# Patient Record
Sex: Male | Born: 1945 | Race: Black or African American | Hispanic: No | Smoking: Former smoker
Health system: Southern US, Community
[De-identification: ages and names within clinical notes are randomized; demographics above are authoritative.]

## PROBLEM LIST (undated history)

## (undated) DIAGNOSIS — M1990 Unspecified osteoarthritis, unspecified site: Secondary | ICD-10-CM

## (undated) DIAGNOSIS — K219 Gastro-esophageal reflux disease without esophagitis: Secondary | ICD-10-CM

## (undated) DIAGNOSIS — N200 Calculus of kidney: Secondary | ICD-10-CM

## (undated) DIAGNOSIS — T7840XA Allergy, unspecified, initial encounter: Secondary | ICD-10-CM

## (undated) DIAGNOSIS — I10 Essential (primary) hypertension: Secondary | ICD-10-CM

## (undated) DIAGNOSIS — N189 Chronic kidney disease, unspecified: Secondary | ICD-10-CM

## (undated) DIAGNOSIS — E785 Hyperlipidemia, unspecified: Secondary | ICD-10-CM

## (undated) DIAGNOSIS — H269 Unspecified cataract: Secondary | ICD-10-CM

## (undated) HISTORY — DX: Gastro-esophageal reflux disease without esophagitis: K21.9

## (undated) HISTORY — DX: Unspecified osteoarthritis, unspecified site: M19.90

## (undated) HISTORY — DX: Chronic kidney disease, unspecified: N18.9

## (undated) HISTORY — DX: Allergy, unspecified, initial encounter: T78.40XA

## (undated) HISTORY — DX: Unspecified cataract: H26.9

## (undated) HISTORY — DX: Calculus of kidney: N20.0

## (undated) HISTORY — PX: FOOT SURGERY: SHX648

## (undated) HISTORY — DX: Hyperlipidemia, unspecified: E78.5

## (undated) HISTORY — DX: Essential (primary) hypertension: I10

---

## 1998-04-27 ENCOUNTER — Ambulatory Visit (HOSPITAL_COMMUNITY): Admission: RE | Admit: 1998-04-27 | Discharge: 1998-04-27 | Payer: Self-pay | Admitting: Internal Medicine

## 1999-11-21 DIAGNOSIS — N189 Chronic kidney disease, unspecified: Secondary | ICD-10-CM

## 1999-11-21 HISTORY — DX: Chronic kidney disease, unspecified: N18.9

## 2003-11-06 ENCOUNTER — Encounter: Payer: Self-pay | Admitting: Internal Medicine

## 2004-10-26 ENCOUNTER — Ambulatory Visit: Payer: Self-pay | Admitting: Internal Medicine

## 2004-12-14 ENCOUNTER — Ambulatory Visit: Payer: Self-pay | Admitting: Internal Medicine

## 2005-03-21 ENCOUNTER — Ambulatory Visit: Payer: Self-pay | Admitting: Internal Medicine

## 2005-05-03 ENCOUNTER — Emergency Department (HOSPITAL_COMMUNITY): Admission: EM | Admit: 2005-05-03 | Discharge: 2005-05-03 | Payer: Self-pay | Admitting: Emergency Medicine

## 2005-05-12 ENCOUNTER — Ambulatory Visit: Payer: Self-pay | Admitting: Internal Medicine

## 2006-05-03 ENCOUNTER — Ambulatory Visit: Payer: Self-pay | Admitting: Internal Medicine

## 2006-06-26 ENCOUNTER — Ambulatory Visit: Payer: Self-pay | Admitting: Internal Medicine

## 2006-11-20 HISTORY — PX: OTHER SURGICAL HISTORY: SHX169

## 2007-05-31 ENCOUNTER — Telehealth (INDEPENDENT_AMBULATORY_CARE_PROVIDER_SITE_OTHER): Payer: Self-pay | Admitting: *Deleted

## 2007-06-25 ENCOUNTER — Ambulatory Visit: Payer: Self-pay | Admitting: Internal Medicine

## 2007-06-25 DIAGNOSIS — I10 Essential (primary) hypertension: Secondary | ICD-10-CM | POA: Insufficient documentation

## 2007-06-25 DIAGNOSIS — N644 Mastodynia: Secondary | ICD-10-CM | POA: Insufficient documentation

## 2007-09-05 DIAGNOSIS — M1990 Unspecified osteoarthritis, unspecified site: Secondary | ICD-10-CM | POA: Insufficient documentation

## 2007-09-13 ENCOUNTER — Ambulatory Visit: Payer: Self-pay | Admitting: Internal Medicine

## 2007-09-13 DIAGNOSIS — J309 Allergic rhinitis, unspecified: Secondary | ICD-10-CM | POA: Insufficient documentation

## 2007-09-13 DIAGNOSIS — E782 Mixed hyperlipidemia: Secondary | ICD-10-CM | POA: Insufficient documentation

## 2007-09-13 DIAGNOSIS — Q6600 Congenital talipes equinovarus, unspecified foot: Secondary | ICD-10-CM | POA: Insufficient documentation

## 2007-09-24 ENCOUNTER — Encounter (INDEPENDENT_AMBULATORY_CARE_PROVIDER_SITE_OTHER): Payer: Self-pay | Admitting: *Deleted

## 2007-10-02 ENCOUNTER — Ambulatory Visit: Payer: Self-pay | Admitting: Gastroenterology

## 2007-10-16 ENCOUNTER — Encounter: Payer: Self-pay | Admitting: Gastroenterology

## 2007-10-16 ENCOUNTER — Ambulatory Visit: Payer: Self-pay | Admitting: Gastroenterology

## 2007-10-16 ENCOUNTER — Encounter: Payer: Self-pay | Admitting: Internal Medicine

## 2008-09-22 ENCOUNTER — Telehealth (INDEPENDENT_AMBULATORY_CARE_PROVIDER_SITE_OTHER): Payer: Self-pay | Admitting: *Deleted

## 2008-10-08 ENCOUNTER — Ambulatory Visit: Payer: Self-pay | Admitting: Internal Medicine

## 2008-10-08 LAB — CONVERTED CEMR LAB
Albumin: 4.1 g/dL (ref 3.5–5.2)
BUN: 15 mg/dL (ref 6–23)
Cholesterol: 133 mg/dL (ref 0–200)
Hgb A1c MFr Bld: 5.5 % (ref 4.6–6.0)
LDL Cholesterol: 83 mg/dL (ref 0–99)
Potassium: 3.2 meq/L — ABNORMAL LOW (ref 3.5–5.1)
Total Protein: 7 g/dL (ref 6.0–8.3)
Triglycerides: 85 mg/dL (ref 0–149)
VLDL: 17 mg/dL (ref 0–40)

## 2008-10-16 ENCOUNTER — Ambulatory Visit: Payer: Self-pay | Admitting: Internal Medicine

## 2008-10-16 DIAGNOSIS — E876 Hypokalemia: Secondary | ICD-10-CM | POA: Insufficient documentation

## 2008-11-17 ENCOUNTER — Ambulatory Visit: Payer: Self-pay | Admitting: Internal Medicine

## 2008-11-24 ENCOUNTER — Encounter (INDEPENDENT_AMBULATORY_CARE_PROVIDER_SITE_OTHER): Payer: Self-pay | Admitting: *Deleted

## 2008-11-24 LAB — CONVERTED CEMR LAB
BUN: 16 mg/dL (ref 6–23)
Chloride: 101 meq/L (ref 96–112)
GFR calc non Af Amer: 80 mL/min
Glucose, Bld: 99 mg/dL (ref 70–99)
Potassium: 3.2 meq/L — ABNORMAL LOW (ref 3.5–5.1)

## 2009-01-26 ENCOUNTER — Ambulatory Visit: Payer: Self-pay | Admitting: Internal Medicine

## 2009-01-28 ENCOUNTER — Telehealth (INDEPENDENT_AMBULATORY_CARE_PROVIDER_SITE_OTHER): Payer: Self-pay | Admitting: *Deleted

## 2009-01-29 ENCOUNTER — Ambulatory Visit: Payer: Self-pay | Admitting: Internal Medicine

## 2009-02-01 ENCOUNTER — Encounter: Payer: Self-pay | Admitting: Internal Medicine

## 2009-02-02 ENCOUNTER — Ambulatory Visit: Payer: Self-pay | Admitting: Internal Medicine

## 2009-02-03 LAB — CONVERTED CEMR LAB
Cortisol, Plasma: 12.4 ug/dL
Potassium: 3.5 meq/L (ref 3.5–5.1)

## 2009-02-04 ENCOUNTER — Encounter (INDEPENDENT_AMBULATORY_CARE_PROVIDER_SITE_OTHER): Payer: Self-pay | Admitting: *Deleted

## 2009-02-09 ENCOUNTER — Ambulatory Visit: Payer: Self-pay | Admitting: Internal Medicine

## 2009-03-12 ENCOUNTER — Encounter: Payer: Self-pay | Admitting: Internal Medicine

## 2009-03-20 DEATH — deceased

## 2009-03-23 ENCOUNTER — Telehealth (INDEPENDENT_AMBULATORY_CARE_PROVIDER_SITE_OTHER): Payer: Self-pay | Admitting: *Deleted

## 2009-03-26 ENCOUNTER — Ambulatory Visit: Payer: Self-pay | Admitting: Internal Medicine

## 2009-03-27 LAB — CONVERTED CEMR LAB
BUN: 16 mg/dL (ref 6–23)
GFR calc non Af Amer: 60.41 mL/min (ref >60)
Potassium: 3.9 meq/L (ref 3.5–5.1)
Sodium: 139 meq/L (ref 135–145)

## 2009-03-29 ENCOUNTER — Encounter (INDEPENDENT_AMBULATORY_CARE_PROVIDER_SITE_OTHER): Payer: Self-pay | Admitting: *Deleted

## 2009-04-02 ENCOUNTER — Encounter: Admission: RE | Admit: 2009-04-02 | Discharge: 2009-04-02 | Payer: Self-pay | Admitting: Nephrology

## 2009-04-02 ENCOUNTER — Encounter: Payer: Self-pay | Admitting: Internal Medicine

## 2009-06-11 ENCOUNTER — Encounter: Payer: Self-pay | Admitting: Internal Medicine

## 2009-12-16 ENCOUNTER — Encounter: Payer: Self-pay | Admitting: Internal Medicine

## 2009-12-31 ENCOUNTER — Encounter (INDEPENDENT_AMBULATORY_CARE_PROVIDER_SITE_OTHER): Payer: Self-pay | Admitting: *Deleted

## 2010-02-11 ENCOUNTER — Ambulatory Visit: Payer: Self-pay | Admitting: Internal Medicine

## 2010-02-11 DIAGNOSIS — I781 Nevus, non-neoplastic: Secondary | ICD-10-CM | POA: Insufficient documentation

## 2010-02-11 DIAGNOSIS — E269 Hyperaldosteronism, unspecified: Secondary | ICD-10-CM | POA: Insufficient documentation

## 2010-03-28 ENCOUNTER — Ambulatory Visit: Payer: Self-pay | Admitting: Internal Medicine

## 2010-03-28 DIAGNOSIS — Z8679 Personal history of other diseases of the circulatory system: Secondary | ICD-10-CM | POA: Insufficient documentation

## 2010-03-28 DIAGNOSIS — R269 Unspecified abnormalities of gait and mobility: Secondary | ICD-10-CM | POA: Insufficient documentation

## 2010-03-29 ENCOUNTER — Ambulatory Visit: Payer: Self-pay | Admitting: Internal Medicine

## 2010-04-05 LAB — CONVERTED CEMR LAB
Basophils Absolute: 0.1 10*3/uL (ref 0.0–0.1)
CO2: 31 meq/L (ref 19–32)
Eosinophils Relative: 6.1 % — ABNORMAL HIGH (ref 0.0–5.0)
GFR calc non Af Amer: 60.22 mL/min (ref >60)
Glucose, Bld: 85 mg/dL (ref 70–99)
Monocytes Relative: 6.8 % (ref 3.0–12.0)
Neutrophils Relative %: 53 % (ref 43.0–77.0)
Platelets: 248 10*3/uL (ref 150.0–400.0)
Potassium: 4.8 meq/L (ref 3.5–5.1)
RDW: 13.1 % (ref 11.5–14.6)
Sodium: 145 meq/L (ref 135–145)
WBC: 7.3 10*3/uL (ref 4.5–10.5)

## 2010-04-08 ENCOUNTER — Telehealth: Payer: Self-pay | Admitting: Internal Medicine

## 2010-05-18 ENCOUNTER — Encounter: Payer: Self-pay | Admitting: Internal Medicine

## 2010-06-23 IMAGING — CT CT ABDOMEN WO/W CM
3 of 8 series · 13 of 36 positions shown, 18 images · IV contrast (READICAT/WATER)
Comparison: None

CLINICAL DATA: Hypertension, hyperaldosteronism, evaluate for
adrenal lesion.

CT ABDOMEN WITHOUT AND WITH CONTRAST, CT PELVIS WITHOUT CONTRAST
TECHNIQUE: Multidetector CT imaging of the abdomen was performed
following the standard protocol before and following the bolus
administration of intravenous contrast.Multidetector CT imaging of
the pelvis was performed following the standard protocol without
intravenous contrast.
Contrast: 125 ml Hmnipaque-3II

[Series 3: adrenal without · axial · non-contrast · 0.81mm/px · z∈[-378,+5]mm · 7 of 205 slices shown, 12 images (1 of 2)]
[im 26/205  soft-tissue]
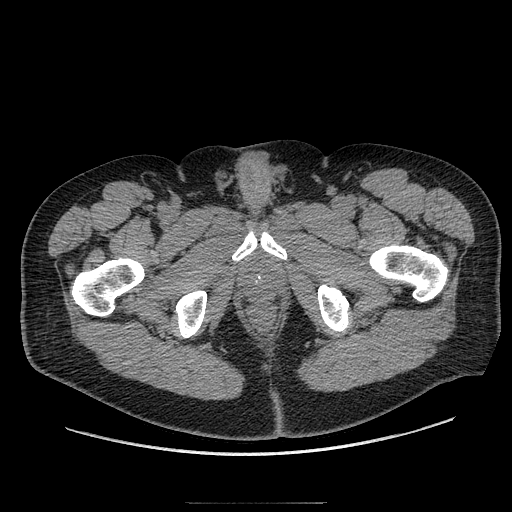
[im 26/205  bone]
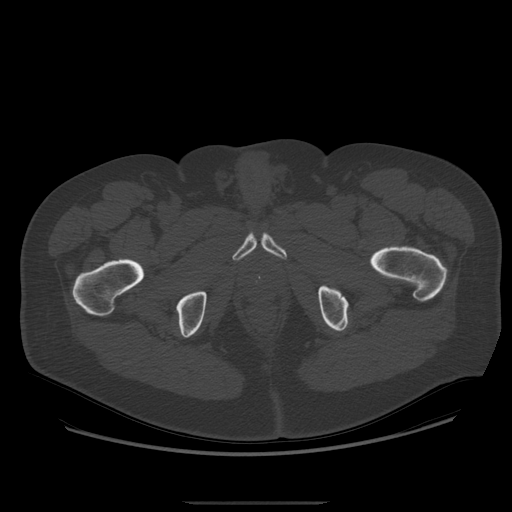
[im 52/205  soft-tissue]
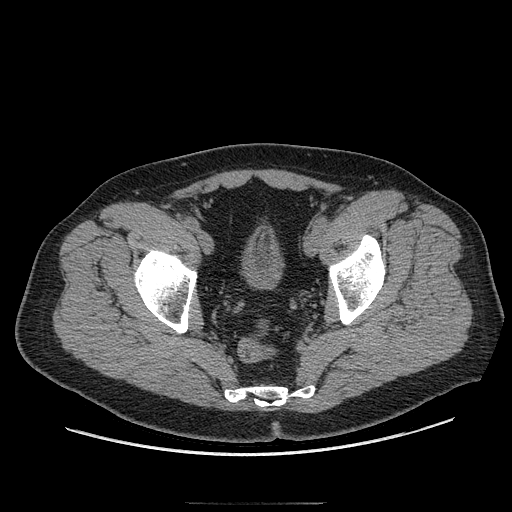
[im 77/205  soft-tissue]
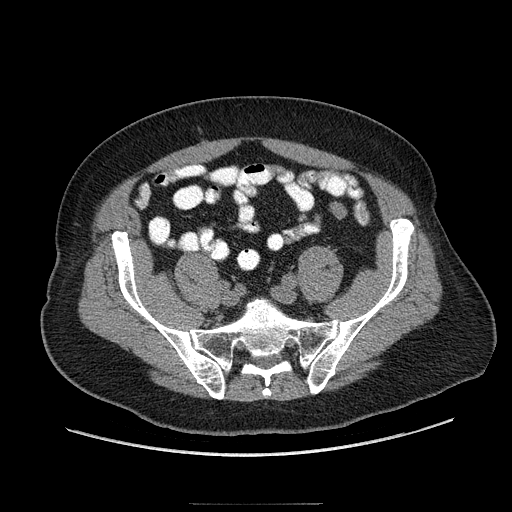
[im 103/205  soft-tissue]
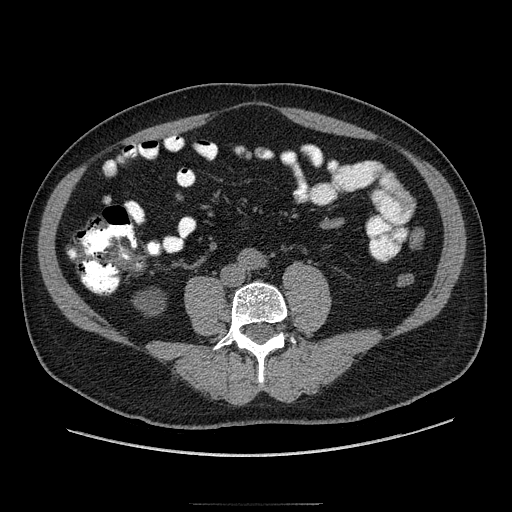
[im 103/205  lung]
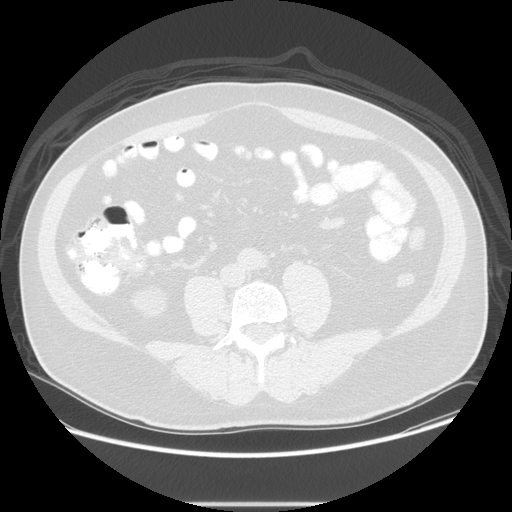
[im 128/205  soft-tissue]
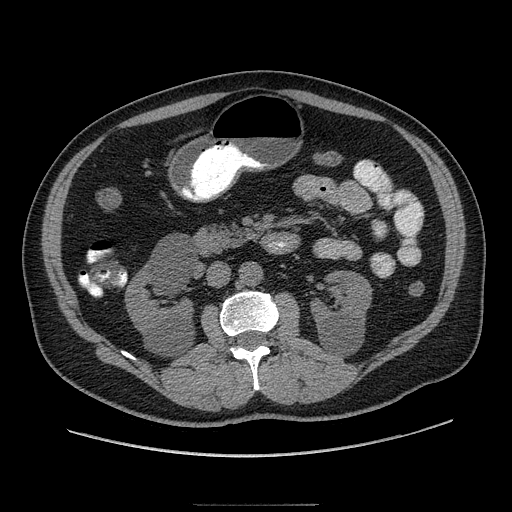
[im 128/205  lung]
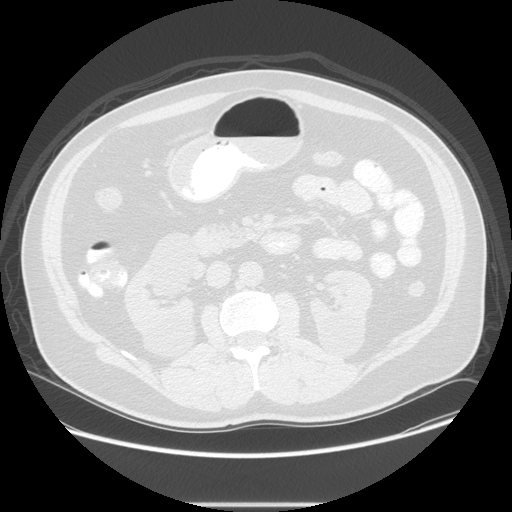
[im 154/205  soft-tissue]
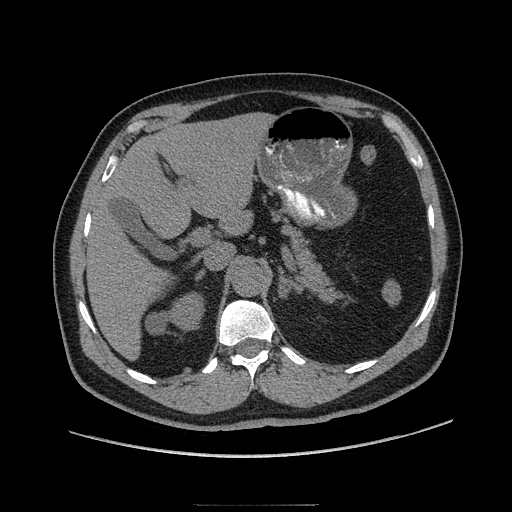
[im 154/205  lung]
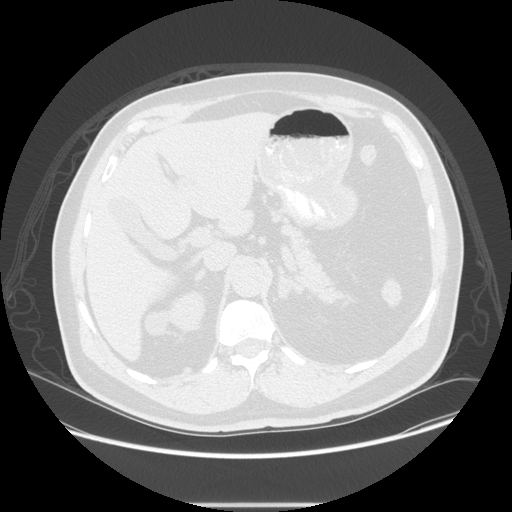
[im 179/205  soft-tissue]
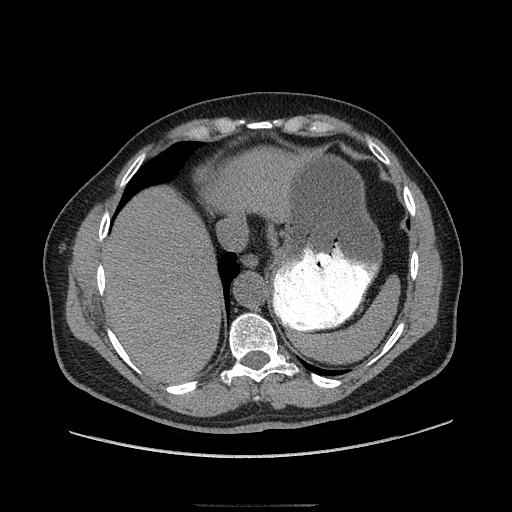
[im 179/205  lung]
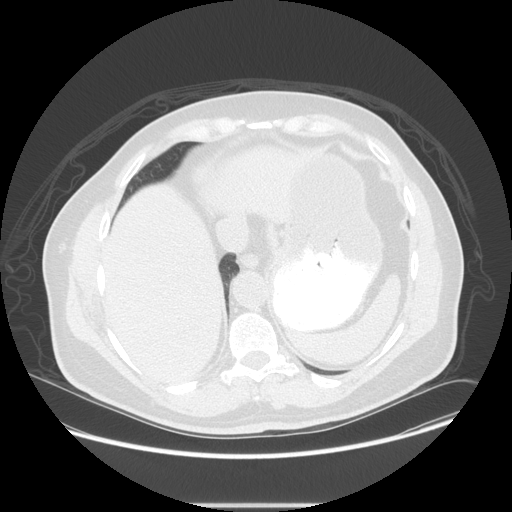

[Series 6: adrenal without · axial · non-contrast · 0.81mm/px · z∈[-162,-8]mm · 3 of 124 slices shown (2 of 2)]
[im 31/124  soft-tissue]
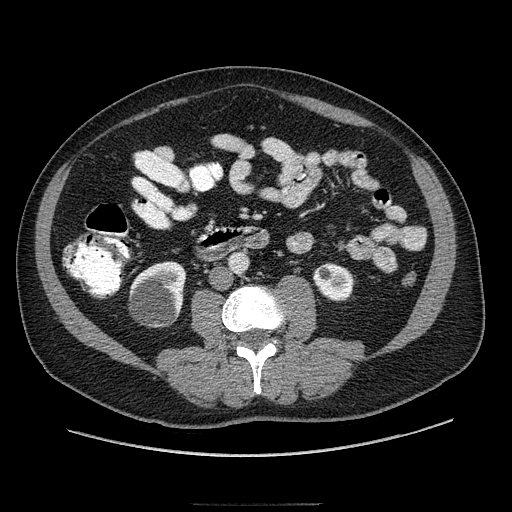
[im 62/124  soft-tissue]
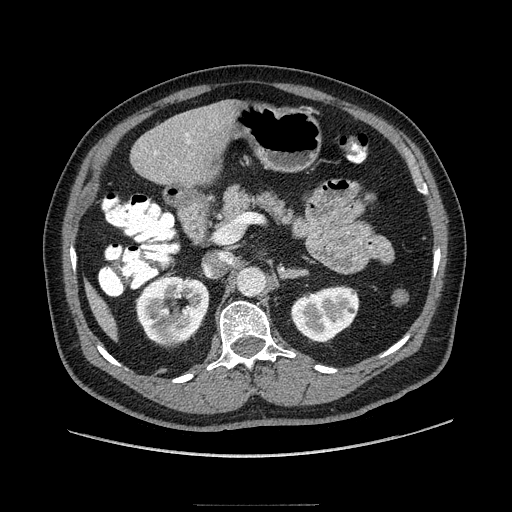
[im 93/124  soft-tissue]
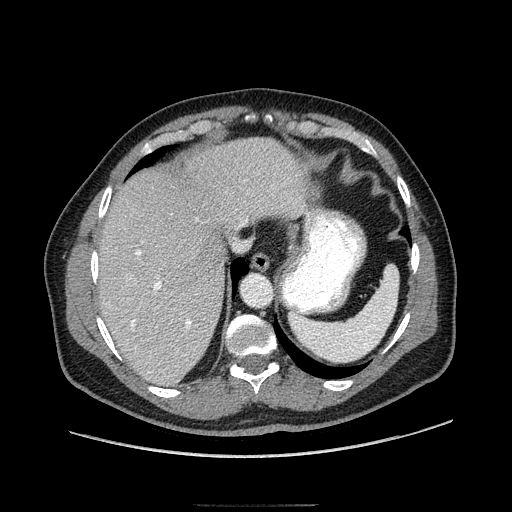

[Series 602: sagittal body · sagittal · 1.00mm/px · 3 of 163 slices shown]
[im 28/163  soft-tissue]
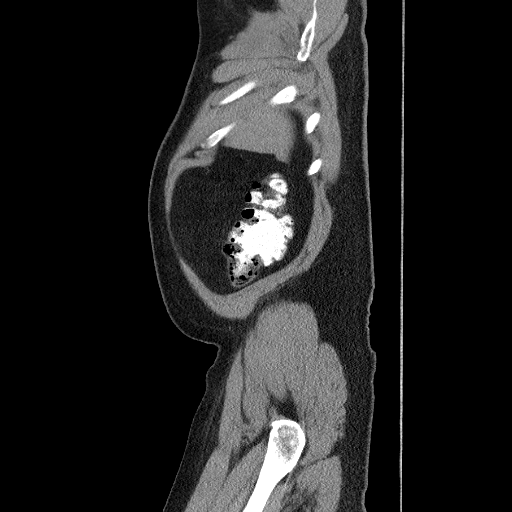
[im 55/163  soft-tissue]
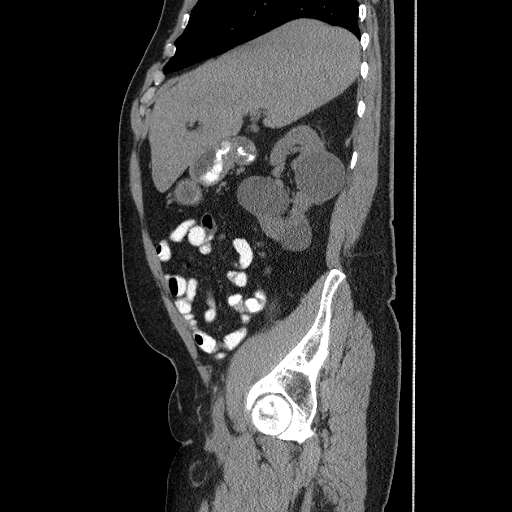
[im 82/163  soft-tissue]
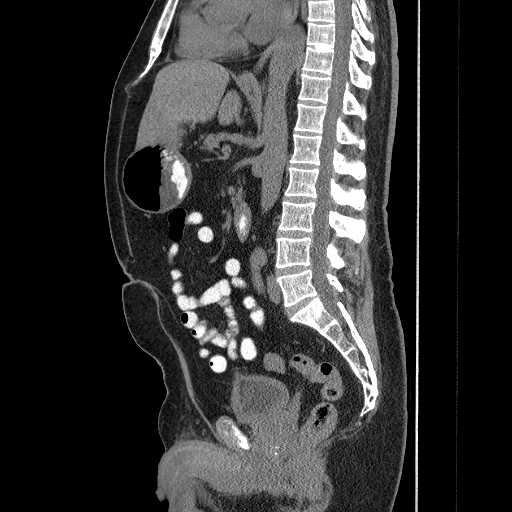

[13 of 36 positions shown; findings below may reference images not displayed]

FINDINGS: A 5 mm noncalcified nodule is noted on image #9 within
the anterior right middle lobe.  No other lung nodule is seen on
the limited views through the lung bases and no effusion is noted.
The right adrenal gland appears grossly normal on the unenhanced
study.  The left adrenal gland is more nodular, and CT with IV
contrast media is to be performed.  On this unenhanced study no
hepatic calcified granulomas are seen and no calcified gallstones
are noted.  No renal calculi are seen.

After contrast administration, the right adrenal gland again is
normal in size with no abnormality noted.  The left adrenal gland
is nodular but somewhat diffusely prominent. On the coronal images
there is nodular enlargement of the lateral limb inferiorly and a
nodule may be present of 16 x 19 mm.  Somewhat asymmetric adrenal
hyperplasia is also a consideration.

The liver enhances and there is a focus of increased enhancement
within the right lobe which may represent incidental hemangioma.  A
small low attenuation structure is noted in the very caudal right
lobe most likely benign, as a small hepatic cyst. The gallbladder
is well visualized with no abnormality noted.  The pancreas is
normal in size and the pancreatic duct is not dilated.  The spleen
is normal in size.  The kidneys enhance and there are multiple
bilateral renal cyst present.  No solid renal lesion is seen.  No
hydronephrosis is noted.  The aorta is normal in caliber.  No
adenopathy is seen.
IMPRESSION: 1. Somewhat nodular enlargement of the left adrenal gland
particularly the lateral limb.  This is consistent with either a
small adrenal adenoma or asymmetric adrenal hyperplasia.  The right
adrenal gland appears completely normal.
2.  Small enhancing focus in the right lobe of liver probably
represents incidental hemangiom, which becomes isodense with
adjacent liver on delayed images.
3.  Multiple  renal cysts bilaterally.
4.  5 mm noncalcified nodule in the anterior right middle lobe.
Consider follow-up.

CT PELVIS WITHOUT CONTRAST
FINDINGS: The urinary bladder is somewhat thick-walled but
decompressed and difficult to assess.  No pelvic mass, fluid, or
adenopathy is seen.  The terminal ileum appears normal, as does the
appendix.  No bony abnormality is seen.
IMPRESSION: No significant abnormality on CT unenhanced of the pelvis.  Urinary
bladder is decompressed and somewhat thick-walled.

## 2010-07-06 ENCOUNTER — Encounter: Payer: Self-pay | Admitting: Internal Medicine

## 2010-09-06 ENCOUNTER — Encounter (INDEPENDENT_AMBULATORY_CARE_PROVIDER_SITE_OTHER): Payer: Self-pay | Admitting: *Deleted

## 2010-10-27 ENCOUNTER — Encounter: Payer: Self-pay | Admitting: Gastroenterology

## 2010-11-01 ENCOUNTER — Encounter: Payer: Self-pay | Admitting: Gastroenterology

## 2010-11-01 ENCOUNTER — Telehealth (INDEPENDENT_AMBULATORY_CARE_PROVIDER_SITE_OTHER): Payer: Self-pay | Admitting: *Deleted

## 2010-11-03 ENCOUNTER — Ambulatory Visit: Payer: Self-pay | Admitting: Gastroenterology

## 2010-11-09 ENCOUNTER — Ambulatory Visit: Payer: Self-pay | Admitting: Gastroenterology

## 2010-11-18 DIAGNOSIS — Z860100 Personal history of colon polyps, unspecified: Secondary | ICD-10-CM | POA: Insufficient documentation

## 2010-11-25 ENCOUNTER — Other Ambulatory Visit: Payer: Self-pay | Admitting: Gastroenterology

## 2010-11-25 ENCOUNTER — Encounter (INDEPENDENT_AMBULATORY_CARE_PROVIDER_SITE_OTHER): Payer: Self-pay | Admitting: *Deleted

## 2010-11-25 DIAGNOSIS — K219 Gastro-esophageal reflux disease without esophagitis: Secondary | ICD-10-CM | POA: Insufficient documentation

## 2010-11-25 DIAGNOSIS — K5900 Constipation, unspecified: Secondary | ICD-10-CM | POA: Insufficient documentation

## 2010-11-25 LAB — IBC PANEL
Iron: 52 ug/dL (ref 42–165)
Saturation Ratios: 15.4 % — ABNORMAL LOW (ref 20.0–50.0)
Transferrin: 241.9 mg/dL (ref 212.0–360.0)

## 2010-11-25 LAB — VITAMIN B12: Vitamin B-12: 384 pg/mL (ref 211–911)

## 2010-11-25 LAB — FERRITIN: Ferritin: 111.5 ng/mL (ref 22.0–322.0)

## 2010-11-25 LAB — FOLATE: Folate: 8.8 ng/mL

## 2010-12-12 ENCOUNTER — Ambulatory Visit
Admission: RE | Admit: 2010-12-12 | Discharge: 2010-12-12 | Payer: Self-pay | Source: Home / Self Care | Attending: Gastroenterology | Admitting: Gastroenterology

## 2010-12-12 ENCOUNTER — Other Ambulatory Visit: Payer: Self-pay | Admitting: Gastroenterology

## 2010-12-13 LAB — FECAL OCCULT BLOOD, IMMUNOCHEMICAL: Fecal Occult Bld: NEGATIVE

## 2010-12-18 LAB — CONVERTED CEMR LAB
ALT: 28 units/L (ref 0–53)
Albumin: 4.6 g/dL (ref 3.5–5.2)
Alkaline Phosphatase: 59 units/L (ref 39–117)
BUN: 17 mg/dL (ref 6–23)
Basophils Relative: 0.4 % (ref 0.0–3.0)
Basophils Relative: 0.5 % (ref 0.0–1.0)
Bilirubin, Direct: 0 mg/dL (ref 0.0–0.3)
CO2: 30 meq/L (ref 19–32)
Calcium: 8.8 mg/dL (ref 8.4–10.5)
Cholesterol: 141 mg/dL (ref 0–200)
Eosinophils Absolute: 0.5 10*3/uL (ref 0.0–0.7)
GFR calc Af Amer: 87 mL/min
GFR calc non Af Amer: 72 mL/min
HCT: 39.8 % (ref 39.0–52.0)
HDL: 32.5 mg/dL — ABNORMAL LOW (ref >39.0)
LDL Cholesterol: 92 mg/dL (ref 0–99)
Lymphocytes Relative: 32.7 % (ref 12.0–46.0)
Lymphs Abs: 1.9 10*3/uL (ref 0.7–4.0)
MCHC: 33.3 g/dL (ref 30.0–36.0)
MCV: 92.8 fL (ref 78.0–100.0)
Monocytes Absolute: 0.4 10*3/uL (ref 0.1–1.0)
Monocytes Relative: 8.2 % (ref 3.0–11.0)
Neutro Abs: 3.2 10*3/uL (ref 1.4–7.7)
Neutro Abs: 3.4 10*3/uL (ref 1.4–7.7)
Neutrophils Relative %: 55.9 % (ref 43.0–77.0)
PSA: 2.31 ng/mL (ref 0.10–4.00)
Platelets: 237 10*3/uL (ref 150–400)
RBC: 4.29 M/uL (ref 4.22–5.81)
RBC: 4.41 M/uL (ref 4.22–5.81)
TSH: 2.25 microintl units/mL (ref 0.35–5.50)
Total CHOL/HDL Ratio: 4.5
Total Protein: 6.9 g/dL (ref 6.0–8.3)
Total Protein: 7.3 g/dL (ref 6.0–8.3)
Triglycerides: 109 mg/dL (ref 0–149)
VLDL: 22 mg/dL (ref 0–40)
VLDL: 22.8 mg/dL (ref 0.0–40.0)

## 2010-12-20 NOTE — Assessment & Plan Note (Signed)
Summary: dizzy/cbs   Vital Signs:  Patient profile:   65 year old male Weight:      209.4 pounds BMI:     27.73 Temp:     98.8 degrees F oral Pulse rate:   79 / minute Resp:     15 per minute BP supine:   130 / 88  (left arm) BP sitting:   136 / 88  (left arm) BP standing:   134 / 90  (left arm) Cuff size:   large  Vitals Entered By: Shonna Chock (Mar 28, 2010 4:33 PM) CC: Dizzy Comments REVIEWED MED LIST, PATIENT AGREED DOSE AND INSTRUCTION CORRECT  Pulse Supine: 68 Pulse Standing: 80   CC:  Dizzy.  History of Present Illness: Onset 4  weeks  ago as sense of  imbalance while walking , unrelated  to position change , fasting , neuro or cardiac triggers. Symptoms lasted < 60 seconds ; no recurrence since but he feels "unsteady" with standing up after being seated  for  a while  or occasionally "imbalanced" as if "wearing new glasses" with  walking. No BPV symptoms. Some anxiety related to symptoms." My gait is like an old person's". No FH CVA or Parkinson's or other Neuro Diseases. Recent labs reviewed; all essentially normal.  Allergies: 1)  ! Asa  Review of Systems Eyes:  Denies blurring, double vision, and vision loss-both eyes. ENT:  Denies decreased hearing, nasal congestion, ringing in ears, and sinus pressure; No frontal headache or purulence; some minor facial pain. CV:  Denies chest pain or discomfort and palpitations. Resp:  Denies shortness of breath. GI:  Denies bloody stools and dark tarry stools. Neuro:  Denies brief paralysis, disturbances in coordination, numbness, sensation of room spinning, tingling, and weakness. Psych:  Complains of anxiety; denies depression, easily angered, easily tearful, and irritability.  Physical Exam  General:  well-nourished,in no acute distress; alert,appropriate and cooperative throughout examination Eyes:  No corneal or conjunctival inflammation noted. EOMI except slightly decreased accommodation. Arcus senilis.Perrla. Field of   Vision grossly normal. Ears:  External ear exam shows no significant lesions or deformities.  Otoscopic examination reveals clear canals, tympanic membranes are intact bilaterally without bulging, retraction, inflammation or discharge. Hearing is grossly normal bilaterally. Whisper heard @ 6 feet . Tuning fork exam WNL. Nose:  External nasal examination shows no deformity or inflammation. Nasal mucosa are pink and moist without lesions or exudates. Mouth:  Oral mucosa and oropharynx without lesions or exudates.  Teeth in good repair; upper & lower partials. Lungs:  Normal respiratory effort, chest expands symmetrically. Lungs are clear to auscultation, no crackles or wheezes. Heart:  Normal rate and regular rhythm. S4 with slurring; increased  S2 ; without gallop, murmur, click, rub or other extra sounds. Pulses:  R and L carotid,radia  pulses are full and equal bilaterally w/o bruits  Extremities:  No clubbing, cyanosis, edema. Neurologic:  alert & oriented X3, cranial nerves II-XII intact, strength normal in all extremities, sensation intact to light touch, gait normal, DTRs symmetrically   decreased @ knees, finger-to-nose normal, and Romberg negative.  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 No  rigidity or spasticity of limbs. No Parkinson's stigmata clinically. RAM WNL; alliterative speech WNL. No pronator drift. Skin:  Intact without suspicious lesions or rashes Cervical Nodes:  No lymphadenopathy noted Axillary Nodes:  No palpable lymphadenopathy Psych:  memory intact for recent and remote, normally interactive, and good eye contact.     Impression &  Recommendations:  Problem # 1:  UNSTEADY GAIT (ICD-781.2)  Problem # 2:  POSTURAL HYPOTENSION, HX OF (ICD-V12.50) not demonstrated on exam  Complete Medication List: 1)  Amlodipine Besylate 5 Mg Tabs (Amlodipine besylate) .Marland Kitchen.. 1 tablet daily 2)  Simvastatin 40 Mg Tabs (Simvastatin) .Marland Kitchen.. 1 at bedtime 3)  Spironolactone 25 Mg Tabs  (Spironolactone) .... 2 by mouth once daily 4)  Cemetidine  .... As needed  Patient Instructions: 1)  Scedule fasting labs:BMP ;CBC w/ Diff . Do Isometric Exercises pre standingas discussed.

## 2010-12-20 NOTE — Letter (Signed)
Summary: Primary Care Appointment Letter  [facility]  [address] [place], [place] [address]   Phone: [phone]  Fax: [phone]    12/31/2009 MRN: [id number]  [facility] [address] [place], [place]  [address]  Dear Mr. Katrinka Blazing,   Your Primary Care Physician Marga Melnick MD has indicated that:    ___X____it is time to schedule an appointment(Please call to schedule an appointment "Physcial with fasting labs") This is necessary to continue refilling medications.    _______you missed your appointment on______ and need to call and          reschedule.    _______you need to have lab work done.    _______you need to schedule an appointment discuss lab or test results.    _______you need to call to reschedule your appointment that is                       scheduled on _________.     Please call our office as soon as possible. Our phone number is [phone]. Please press option 1. Our office is open 8a-5p, Monday through Friday.     Thank you,    [facility]

## 2010-12-20 NOTE — Letter (Signed)
Summary: [facility]  [facility]   Imported By: Lanelle Bal 07/14/2010 13:50:54  _____________________________________________________________________  External Attachment:    Type:   Image     Comment:   External Document

## 2010-12-20 NOTE — Progress Notes (Signed)
Summary: gait still off, dizinness improving  Phone Note Call from Patient Call back at Work Phone [phone]   Caller: Patient Summary of Call: pt still c/o muscle weakness in legs when standing causing gait to be a little shaky. pt states that dizziness has improve some but still have episode periodically.................Marland KitchenFelecia Deloach CMA  Apr 08, 2010 1:06 PM

## 2010-12-20 NOTE — Assessment & Plan Note (Signed)
Summary: cpx/kdc   Vital Signs:  Patient profile:   65 year old male Height:      73 inches Weight:      209.2 pounds BMI:     27.70 Temp:     98.2 degrees F oral Pulse rate:   71 / minute Resp:     14 per minute BP sitting:   124 / 86  (left arm) Cuff size:   large  Vitals Entered By: Shonna Chock (February 11, 2010 11:09 AM)  Comments REVIEWED MED LIST, PATIENT AGREED DOSE AND INSTRUCTION CORRECT    History of Present Illness: Mr. Buras is here for a physical; he is asymptomatic. Preventive healthcare measures reviewed; these are up to date except for POA designation. Dr Reginal Lutes 's evaluation reviewed.  Preventive Screening-Counseling & Management  Alcohol-Tobacco     Smoking Status: quit  Allergies: 1)  ! Jonne Ply  Past History:  Past Medical History: rhinoconjunctivitis seasonal renal calculus, Dr Larey Dresser Colonic polyps, hx of, Dr Jarold Motto Hypertension Hyperlipidemia: NMR 2004: LDL 122( 1637/1067), TG 134,HDL 35. LDL goal = < 100. Hypokalemia in context of Hyperaldosterone state, resolved with Spironolactone  Past Surgical History: congenital clubbing of  feet, S/P corrective surgery X 12 renal calculus X 1 , passed spontaneously ER visit for palpitations ; hypokalemia documented Colon polypectomy 2008, Dr Jarold Motto, due 2011  Family History: Father: d  MVA Mother: HTN,lipids Siblings:1/2  bros  arthritis ; MGF DM;MGM breast cancer  Social History: no diet Occupation: Brewing technologist  Alcohol use-no Regular exercise-no Widower, wife died of Pulmonary HTN 12-04-2022 Former Smoker: quit 1973  Review of Systems  The patient denies anorexia, fever, weight loss, weight gain, vision loss, decreased hearing, hoarseness, chest pain, syncope, dyspnea on exertion, peripheral edema, prolonged cough, headaches, hemoptysis, abdominal pain, melena, hematochezia, severe indigestion/heartburn, hematuria, incontinence, depression, unusual weight change,  abnormal bleeding, enlarged lymph nodes, and angioedema.         Some "light flashes" peripherally intermittently. Increase in # of skin tags; no PMH of skin cancer.  Physical Exam  General:  well-nourished; alert,appropriate and cooperative throughout examination Head:  Normocephalic and atraumatic without obvious abnormalities. No apparent alopecia ; goatee Eyes:  No corneal or conjunctival inflammation noted. EOMI. Perrla but small Ears:  External ear exam shows no significant lesions or deformities.  Otoscopic examination reveals clear canals, tympanic membranes are intact bilaterally without bulging, retraction, inflammation or discharge. Hearing is grossly normal bilaterally. Nose:  External nasal examination shows no deformity or inflammation. Nasal mucosa are pink and moist without lesions or exudates. Mouth:  Oral mucosa and oropharynx without lesions or exudates.  Teeth in good repair. Upper partials Neck:  No deformities, masses, or tenderness noted. Lungs:  Normal respiratory effort, chest expands symmetrically. Lungs are clear to auscultation, no crackles or wheezes. Heart:  Normal rate and regular rhythm. S1  normal without gallop, murmur, click, rub or other extra sounds. S2 accentuated Abdomen:  Bowel sounds positive,abdomen soft and non-tender without masses, organomegaly or hernias noted. Rectal:  No external abnormalities noted. Normal sphincter tone. No rectal masses or tenderness. Genitalia:  Testes bilaterally descended without nodularity or masses, but tender L epididymal area w/o palpable abnormality. No scrotal masses or lesions. No penis lesions or urethral discharge. Prostate:  Prostate gland firm and smooth, no enlargement, nodularity, tenderness, mass, asymmetry or induration. Msk:  Slight asymmetry of thoracic musculature Pulses:  R and L carotid,radial,femoral,dorsalis pedis and posterior tibial pulses are full and equal bilaterally Extremities:  No clubbing,  cyanosis, edema. Post op & congenital feet deformities   Neurologic:  alert & oriented X3 and DTRs symmetrical and normal.   Skin:  Irregular leaf nevus R ant chest; myriad nevi. Cherry angiomata.   Cervical Nodes:  No lymphadenopathy noted Axillary Nodes:  No palpable lymphadenopathy Inguinal Nodes:  No significant adenopathy Psych:  memory intact for recent and remote, normally interactive, and good eye contact.     Impression & Recommendations:  Problem # 1:  ROUTINE GENERAL MEDICAL EXAM@HEALTH  CARE FACL (ICD-V70.0)  Orders: EKG w/ Interpretation (93000) Venipuncture (60454) TLB-Lipid Panel (80061-LIPID) TLB-CBC Platelet - w/Differential (85025-CBCD) TLB-Hepatic/Liver Function Pnl (80076-HEPATIC) TLB-TSH (Thyroid Stimulating Hormone) (84443-TSH) TLB-PSA (Prostate Specific Antigen) (84153-PSA) Dermatology Referral (Derma)  Problem # 2:  HYPERALDOSTERONISM (ICD-255.10)  controlled with Spironolactone  Orders: Venipuncture (09811) Prescription Created Electronically [phone])  Problem # 3:  HYPOPOTASSEMIA (ICD-276.8)  due to #2, corrected  Orders: Venipuncture (29562)  Problem # 4:  HYPERTENSION (ICD-401.9)  controlled His updated medication list for this problem includes:    Amlodipine Besylate 5 Mg Tabs (Amlodipine besylate) .Marland Kitchen... 1 tablet daily    Spironolactone 25 Mg Tabs (Spironolactone) .Marland Kitchen... 2 by mouth once daily  Orders: EKG w/ Interpretation (93000) Venipuncture (13086) Prescription Created Electronically [phone])  Problem # 5:  HYPERLIPIDEMIA (ICD-272.2)  His updated medication list for this problem includes:    Simvastatin 40 Mg Tabs (Simvastatin) .Marland Kitchen... 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 1 tablet daily 2)  Simvastatin 40 Mg Tabs (Simvastatin) .Marland Kitchen.. 1 at bedtime 3)  Spironolactone 25 Mg Tabs (Spironolactone) .... 2 by mouth once daily 4)  Cemetidine  .... As needed  Patient Instructions: 1)  Check your Blood Pressure regularly. If it is above: 135/85 ON AVERAGE you should make an appointment. Consider POA as discussed Prescriptions: SPIRONOLACTONE 25 MG TABS (SPIRONOLACTONE) 2 by mouth once daily  #180 x 3   Entered and Authorized by:   Marga Melnick MD   Signed by:   Marga Melnick MD on 02/11/2010   Method used:   Faxed to ...       [facility] [address] [phone]* (retail)       [address]       [place], [place]  [address]       Ph: [phone] or [phone]       Fax: [phone]   RxID:   [id number] SIMVASTATIN 40 MG  TABS (SIMVASTATIN) 1 at bedtime  #90 x 3   Entered and Authorized by:   Marga Melnick MD   Signed by:   Marga Melnick MD on 02/11/2010   Method used:   Faxed to ...       [facility] [address] [phone]* (retail)       [address]       [place], [place]  [address]       Ph: [phone] or [phone]       Fax: [phone]   RxID:   [id number]   Appended Document: cpx/kdc  Laboratory Results   Urine Tests   Date/Time Reported: February 11, 2010 1:22 PM   Routine Urinalysis  Color: yellow Appearance: Clear Glucose: negative   (Normal Range: Negative) Bilirubin: negative   (Normal Range: Negative) Ketone: negative   (Normal Range: Negative) Spec. Gravity: 1.015   (Normal Range: 1.003-1.035) Blood: negative   (Normal Range: Negative) pH: 5.0   (Normal Range: 5.0-8.0) Protein: negative   (Normal Range: Negative) Urobilinogen: negative   (Normal Range: 0-1) Nitrite: negative   (Normal Range: Negative) Leukocyte Esterace: negative   (Normal Range: Negative)    Comments: Floydene Flock  February 11, 2010 1:23 PM

## 2010-12-20 NOTE — Consult Note (Signed)
Summary: [facility]  [facility]   Imported By: Lanelle Bal 05/30/2010 10:45:37  _____________________________________________________________________  External Attachment:    Type:   Image     Comment:   External Document

## 2010-12-20 NOTE — Letter (Signed)
Summary: Pre Visit Letter Revised  [facility]  [address] [place], [place] [address]   Phone: [phone]  Fax: [phone]        10/27/2010 MRN: [id number]  Kevin Ochoa [address] [place], [place]  [address]             Procedure Date:  12-21 at 11am           Dr Jarold Motto - Recall Colon   Welcome to the Gastroenterology Division at [facility].    You are scheduled to see a nurse for your pre-procedure visit on 11-03-10 at 11am on the 3rd floor at [facility], [address]  We ask that you try to arrive at our office 15 minutes prior to your appointment time to allow for check-in.  Please take a minute to review the attached form.  If you answer "Yes" to one or more of the questions on the first page, we ask that you call the person listed at your earliest opportunity.  If you answer "No" to all of the questions, please complete the rest of the form and bring it to your appointment.    Your nurse visit will consist of discussing your medical and surgical history, your immediate family medical history, and your medications.   If you are unable to list all of your medications on the form, please bring the medication bottles to your appointment and we will list them.  We will need to be aware of both prescribed and over the counter drugs.  We will need to know exact dosage information as well.    Please be prepared to read and sign documents such as consent forms, a financial agreement, and acknowledgement forms.  If necessary, and with your consent, a friend or relative is welcome to sit-in on the nurse visit with you.  Please bring your insurance card so that we may make a copy of it.  If your insurance requires a referral to see a specialist, please bring your referral form from your primary care physician.  No co-pay is required for this nurse visit.     If you cannot keep your appointment, please call [phone] to cancel or reschedule  prior to your appointment date.  This allows [place] the opportunity to schedule an appointment for another patient in need of care.    Thank you for choosing [facility] for your medical needs.  We appreciate the opportunity to care for you.  Please visit [place] at our website  to learn more about our practice.  Sincerely, The Gastroenterology Division

## 2010-12-20 NOTE — Letter (Signed)
Summary: Colonoscopy Letter  [facility]  [address] [place], [place] [address]   Phone: [phone]  Fax: [phone]      September 06, 2010 MRN: [id number]   [facility] [address] [place], [place]  [address]   Dear Kevin Ochoa,   According to your medical record, it is time for you to schedule a Colonoscopy. The American Cancer Society recommends this procedure as a method to detect early colon cancer. Patients with a family history of colon cancer, or a personal history of colon polyps or inflammatory bowel disease are at increased risk.  This letter has been generated based on the recommendations made at the time of your procedure. If you feel that in your particular situation this may no longer apply, please contact our office.  Please call our office at [phone] to schedule this appointment or to update your records at your earliest convenience.  Thank you for cooperating with [place] to provide you with the very best care possible.   Sincerely,   Vania Rea. Jarold Motto, M.D.  [facility] Gastroenterology Division [phone]

## 2010-12-20 NOTE — Letter (Signed)
Summary: [facility]  [facility]   Imported By: Lanelle Bal 02/09/2010 10:05:31  _____________________________________________________________________  External Attachment:    Type:   Image     Comment:   External Document

## 2010-12-22 NOTE — Progress Notes (Signed)
Summary: Pt. on Plavix   Phone Note Outgoing Call   Call placed by: Almyra Brace Call placed to: Patient Reason for Call: Get patient information Details for Reason: Verify if pt. on Plavix Summary of Call: Spoke with pt. and he is on Plavix. His Neurologist prescribes it.  Advised pt. that a scheduler would be calling him to set up an Office visit due to our policy on blood thinners. Initial call taken by: Wyona Almas RN,  November 01, 2010 12:58 PM

## 2010-12-22 NOTE — Assessment & Plan Note (Signed)
Summary: RECALL COLON/YF    History of Present Illness Visit Type: Initial Consult Primary GI MD: Sheryn Bison MD FACP FAGA Primary Provider: Marga Melnick, MD  Requesting Provider: Marga Melnick, MD  Chief Complaint: Recall colon. Pt c/o occasional GERD and constipation. Pt is on Plavix  History of Present Illness:   NO GI COMPLAINTS.Marland KitchenPOLYP REMOVED 3 Y AGO.HAS HAD PLAVIX RX. PER [facility] PER NEUROLOGY.   GI Review of Systems    Reports acid reflux and  heartburn.      Denies abdominal pain, belching, bloating, chest pain, dysphagia with liquids, dysphagia with solids, loss of appetite, nausea, vomiting, vomiting blood, weight loss, and  weight gain.      Reports constipation.     Denies anal fissure, black tarry stools, change in bowel habit, diarrhea, diverticulosis, fecal incontinence, heme positive stool, hemorrhoids, irritable bowel syndrome, jaundice, light color stool, liver problems, rectal bleeding, and  rectal pain.    Current Medications (verified): 1)  Amlodipine Besylate 5 Mg  Tabs (Amlodipine Besylate) .Marland Kitchen.. 1 Tablet Daily 2)  Simvastatin 40 Mg  Tabs (Simvastatin) .Marland Kitchen.. 1 At Bedtime 3)  Spironolactone 25 Mg Tabs (Spironolactone) .... 2 By Mouth Once Daily 4)  Cimetidine Acid Reducer 200 Mg Tabs (Cimetidine) .... As Needed 5)  Plavix 75 Mg Tabs (Clopidogrel Bisulfate) .... One Tablet By Mouth Once Daily  Allergies (verified): 1)  ! Asa  Past History:  Past medical, surgical, family and social histories (including risk factors) reviewed for relevance to current acute and chronic problems.  Past Medical History: rhinoconjunctivitis seasonal renal calculus, Dr Larey Dresser Colonic polyps adenomatous polyp , Dr Jarold Motto Hypertension Hyperlipidemia: NMR 2004: LDL 122( 1637/1067), TG 134,HDL 35. LDL goal = < 100. Hypokalemia in context of Hyperaldosterone state, resolved with Spironolactone Arthritis GERD  Past Surgical History: Reviewed history from  02/11/2010 and no changes required. congenital clubbing of  feet, S/P corrective surgery X 12 renal calculus X 1 , passed spontaneously ER visit for palpitations ; hypokalemia documented Colon polypectomy 2008, Dr Jarold Motto, due 2011  Family History: Reviewed history from 02/11/2010 and no changes required. Father: d  MVA Mother: HTN,lipids Siblings:1/2  bros  arthritis ; MGF DM;MGM breast cancer No FH of Colon Cancer:  Social History: Reviewed history from 02/11/2010 and no changes required. Occupation: Brewing technologist  Childern Alcohol use-no Regular exercise-no Widower, wife died of Pulmonary HTN December 05, 2022 Former Smoker: quit 1973 Daily Caffeine Use: less then 1  no diet   Review of Systems       The patient complains of allergy/sinus and arthritis/joint pain.  The patient denies anemia, anxiety-new, back pain, blood in urine, breast changes/lumps, change in vision, confusion, cough, coughing up blood, depression-new, fainting, fatigue, fever, headaches-new, hearing problems, heart murmur, heart rhythm changes, itching, menstrual pain, muscle pains/cramps, night sweats, nosebleeds, pregnancy symptoms, shortness of breath, skin rash, sleeping problems, sore throat, swelling of feet/legs, swollen lymph glands, thirst - excessive , urination - excessive , urination changes/pain, urine leakage, vision changes, and voice change.    Vital Signs:  Patient profile:   65 year old male Height:      73 inches Weight:      211 pounds BMI:     27.94 BSA:     2.20 Pulse rate:   88 / minute Pulse rhythm:   regular BP sitting:   136 / 74  (left arm) Cuff size:   regular  Vitals Entered By: Ok Anis CMA (November 25, 2010 9:41 AM)  Physical  Exam  General:  Well developed, well nourished, no acute distress.healthy appearing.   Head:  Normocephalic and atraumatic. Eyes:  PERRLA, no icterus.exam deferred to patient's ophthalmologist.   Neck:  Supple; no masses or  thyromegaly.NO BRUITS NOTED. Lungs:  Clear throughout to auscultation. Heart:  Regular rate and rhythm; no murmurs, rubs,  or bruits. Abdomen:  Soft, nontender and nondistended. No masses, hepatosplenomegaly or hernias noted. Normal bowel sounds. Rectal:  deferred until time of colonoscopy.   Extremities:  No clubbing, cyanosis, edema or deformities noted. Neurologic:  Alert and  oriented x4;  grossly normal neurologically. Cervical Nodes:  No significant cervical adenopathy. Psych:  Alert and cooperative. Normal mood and affect.   Impression & Recommendations:  Problem # 1:  CONSTIPATION (ICD-564.00) Assessment Unchanged INCREASE FIBER AND as needed MIRALAX.Marland Kitchen  Problem # 2:  COLONIC POLYPS, ADENOMATOUS, HX OF (ICD-V12.72) Assessment: Unchanged CHECK STOOL CARDS AND ANEMIA PROFILE....REPEAT EXAM 2 YEARS.Marland Kitchen Orders: TLB-B12, Serum-Total ONLY (04540-J81) TLB-Ferritin (82728-FER) TLB-Folic Acid (Folate) (82746-FOL) TLB-IBC Pnl (Iron/FE;Transferrin) (83550-IBC)  Patient Instructions: 1)  Copy sent to : Marga Melnick, MD & Levert Feinstein, MD 2)  We will mail you a reminder letter in 2014 about your recall colonoscopy. 3)  Please go to the basement today for your labs.  4)  The medication list was reviewed and reconciled.  All changed / newly prescribed medications were explained.  A complete medication list was provided to the patient / caregiver.

## 2010-12-22 NOTE — Letter (Signed)
Summary: New Patient letter  [facility]  [address] [place], [place] [address]   Phone: [phone]  Fax: [phone]       11/01/2010 MRN: [id number]  [facility] [address] [place], [place]  [address]  Dear Mr. Coard,  Welcome to the Gastroenterology Division at [facility].    You are scheduled to see Dr.  Jarold Motto on 11-25-2010 at 9:30am on the 3rd floor at [facility], [address]  We ask that you try to arrive at our office 15 minutes prior to your appointment time to allow for check-in.  We would like you to complete the enclosed self-administered evaluation form prior to your visit and bring it with you on the day of your appointment.  We will review it with you.  Also, please bring a complete list of all your medications or, if you prefer, bring the medication bottles and we will list them.  Please bring your insurance card so that we may make a copy of it.  If your insurance requires a referral to see a specialist, please bring your referral form from your primary care physician.  Co-payments are due at the time of your visit and may be paid by cash, check or credit card.     Your office visit will consist of a consult with your physician (includes a physical exam), any laboratory testing he/she may order, scheduling of any necessary diagnostic testing (e.g. x-ray, ultrasound, CT-scan), and scheduling of a procedure (e.g. Endoscopy, Colonoscopy) if required.  Please allow enough time on your schedule to allow for any/all of these possibilities.    If you cannot keep your appointment, please call [phone] to cancel or reschedule prior to your appointment date.  This allows [place] the opportunity to schedule an appointment for another patient in need of care.  If you do not cancel or reschedule by 5 p.m. the business day prior to your appointment date, you will be charged a $50.00 late cancellation/no-show fee.    Thank you for choosing  [facility] for your medical needs.  We appreciate the opportunity to care for you.  Please visit [place] at our website  to learn more about our practice.                     Sincerely,                                                             The Gastroenterology Division

## 2010-12-22 NOTE — Letter (Signed)
Summary: [facility]: Immunoassay Fecal Occult Blood (iFOB) Order [facility]  [address] [place], [place] [address]   Phone: [phone]  Fax: [phone]      [facility]: Immunoassay Fecal Occult Blood (iFOB) Order Form   November 25, 2010 MRN: [id number]   Kevin Ochoa Dec 03, 1945   Physicican Name:Dr Sheryn Bison  Diagnosis Code:v12.72      Harlow Mares CMA (AAMA)

## 2011-03-10 ENCOUNTER — Other Ambulatory Visit: Payer: Self-pay | Admitting: Internal Medicine

## 2011-03-13 ENCOUNTER — Other Ambulatory Visit: Payer: Self-pay | Admitting: *Deleted

## 2011-03-13 NOTE — Telephone Encounter (Signed)
30 MIN CPX APPOINTMENT DUE

## 2011-05-10 ENCOUNTER — Other Ambulatory Visit: Payer: Self-pay | Admitting: Internal Medicine

## 2011-05-31 ENCOUNTER — Other Ambulatory Visit: Payer: Self-pay | Admitting: Internal Medicine

## 2011-05-31 MED ORDER — SPIRONOLACTONE 25 MG PO TABS: 25.0000 mg | ORAL_TABLET | Freq: Two times a day (BID) | ORAL | Status: DC

## 2011-05-31 MED ORDER — SIMVASTATIN 40 MG PO TABS: 40.0000 mg | ORAL_TABLET | Freq: Every day | ORAL | Status: DC

## 2011-05-31 NOTE — Telephone Encounter (Addendum)
Left message on voicemail for patient informing him we will fill 2 meds #90 with no refills (last appointment was over 1 year ago), at pending appointment for 06/2011 patient will be given refills. I informed patient he will have to contact his Neurologist for refills on Plavix (phone note in centricity indicates plavix is rx'ed by Neuro)

## 2011-07-06 ENCOUNTER — Ambulatory Visit (INDEPENDENT_AMBULATORY_CARE_PROVIDER_SITE_OTHER): Payer: 59 | Admitting: Internal Medicine

## 2011-07-06 ENCOUNTER — Encounter: Payer: Self-pay | Admitting: Internal Medicine

## 2011-07-06 VITALS — BP 126/90 | HR 82 | Temp 98.9°F | Resp 12 | Ht 73.0 in | Wt 205.8 lb

## 2011-07-06 DIAGNOSIS — Z860100 Personal history of colon polyps, unspecified: Secondary | ICD-10-CM

## 2011-07-06 DIAGNOSIS — Z0000 Encounter for general adult medical examination without abnormal findings: Secondary | ICD-10-CM

## 2011-07-06 DIAGNOSIS — I10 Essential (primary) hypertension: Secondary | ICD-10-CM

## 2011-07-06 DIAGNOSIS — E782 Mixed hyperlipidemia: Secondary | ICD-10-CM

## 2011-07-06 LAB — BASIC METABOLIC PANEL
CO2: 27 mEq/L (ref 19–32)
Chloride: 100 mEq/L (ref 96–112)
Potassium: 3.9 mEq/L (ref 3.5–5.1)
Sodium: 137 mEq/L (ref 135–145)

## 2011-07-06 LAB — CBC WITH DIFFERENTIAL/PLATELET
Basophils Relative: 0.6 % (ref 0.0–3.0)
Eosinophils Absolute: 0.4 10*3/uL (ref 0.0–0.7)
Eosinophils Relative: 6.9 % — ABNORMAL HIGH (ref 0.0–5.0)
Hemoglobin: 13.1 g/dL (ref 13.0–17.0)
MCHC: 33.4 g/dL (ref 30.0–36.0)
MCV: 90.6 fl (ref 78.0–100.0)
Monocytes Absolute: 0.4 10*3/uL (ref 0.1–1.0)
Neutro Abs: 3 10*3/uL (ref 1.4–7.7)
RBC: 4.32 Mil/uL (ref 4.22–5.81)

## 2011-07-06 LAB — LIPID PANEL
HDL: 44.5 mg/dL (ref >39.00)
LDL Cholesterol: 90 mg/dL (ref 0–99)
Total CHOL/HDL Ratio: 4

## 2011-07-06 LAB — HEPATIC FUNCTION PANEL
Albumin: 4.7 g/dL (ref 3.5–5.2)
Alkaline Phosphatase: 49 U/L (ref 39–117)
Bilirubin, Direct: 0 mg/dL (ref 0.0–0.3)

## 2011-07-06 LAB — TSH: TSH: 1.12 u[IU]/mL (ref 0.35–5.50)

## 2011-07-06 MED ORDER — SPIRONOLACTONE 25 MG PO TABS: 25.0000 mg | ORAL_TABLET | Freq: Two times a day (BID) | ORAL | Status: DC

## 2011-07-06 MED ORDER — SIMVASTATIN 40 MG PO TABS: 40.0000 mg | ORAL_TABLET | Freq: Every day | ORAL | Status: DC

## 2011-07-06 NOTE — Patient Instructions (Signed)
Preventive Health Care: Exercise at least 30-45 minutes a day,  3-4 days a week.  Eat a low-fat diet with lots of fruits and vegetables, up to 7-9 servings per day. Consume less than 40 grams of sugar per day from foods & drinks with High Fructose Corn Sugar as # 1,2,3 or # 4 on label. Health Care Power of Attorney & Living Will. Complete if not in place ; these place you in charge of your health care decisions. 

## 2011-07-06 NOTE — Progress Notes (Signed)
Subjective:    Patient ID: Kevin Ochoa, male    DOB: [birth year], 65 y.o.   MRN: [id number]  HPI  Kevin Ochoa  is here for a physical; he has no acute issues.     Review of Systems#1  HYPERTENSION: Disease Monitoring: Blood pressure range-well controlled, typically 120/80  Chest pain, palpitations- no       Dyspnea- no Medications: Compliance- yes  Lightheadedness,Syncope- lightheadedness improved with avoiding prolonged fasting  & fluid replacement    Edema- no #2 HYPERLIPIDEMIA: Disease Monitoring: See symptoms for Hypertension Medications: Compliance- yes  Abd pain, bowel changes- no   Muscle aches- no but some weakness from lack of exercise WGN:FAOZHYQM/VHQION/GEXBMW- no       Visual problems- no Hypoglycemic symptoms- controlled as noted           Objective:   Physical Exam Gen.: Healthy and well-nourished in appearance. Alert, appropriate and cooperative throughout exam. Head: Normocephalic without obvious abnormalities;goatee Eyes: No corneal or conjunctival inflammation noted. Pupils equal round reactive to light and accommodation. Fundal exam is benign without hemorrhages, exudate, papilledema. Extraocular motion intact. Vision grossly norma with lenses. Arcus senilis. Ears: External  ear exam reveals no significant lesions or deformities. Canals clear .TMs normal. Hearing is grossly normal bilaterally. Nose: External nasal exam reveals no deformity or inflammation. Nasal mucosa are pink and moist. No lesions or exudates noted. Septum normal Mouth: Oral mucosa and oropharynx reveal no lesions or exudates. Teeth in good repair. Upper & lower partials Neck: No deformities, masses, or tenderness noted. Range of motion &. Thyroid  normal. Lungs: Normal respiratory effort; chest expands symmetrically. Lungs are clear to auscultation without rales, wheezes, or increased work of breathing. Heart: Normal rate and rhythm. Normal S1 and increased  S2. No gallop, click, or  rub. S4 with slurring murmur. Abdomen: Bowel sounds normal; abdomen soft and nontender. No masses, organomegaly or hernias noted. Genitalia/ DRE:  Normal; no prostate nodularity or enlargement   .                                                                                   Musculoskeletal/extremities: No deformity or scoliosis noted of  the thoracic or lumbar spine but R thoracic muscles larger than L. No clubbing, cyanosis, edema noted. Range of motion  normal .Tone & strength  Normal. Post op foot & toe deformities  Vascular: Carotid, radial artery, dorsalis pedis and  posterior tibial pulses are full and equal. No bruits present. Neurologic: Alert and oriented x3. Deep tendon reflexes symmetrical and normal.          Skin: Intact without suspicious lesions or rashes. Irregular nevus R chest ("birthmark") stable by history. Lymph: No cervical, axillary, or inguinal lymphadenopathy present. Psych: Mood and affect are normal. Normally interactive  Assessment & Plan:  #1 comprehensive physical exam; no acute findings #2 see Problem List with Assessments & Recommendations Plan: see Orders

## 2011-09-01 ENCOUNTER — Other Ambulatory Visit: Payer: Self-pay | Admitting: Internal Medicine

## 2011-09-03 ENCOUNTER — Other Ambulatory Visit: Payer: Self-pay | Admitting: Internal Medicine

## 2011-12-18 ENCOUNTER — Other Ambulatory Visit: Payer: Self-pay | Admitting: Internal Medicine

## 2012-05-17 ENCOUNTER — Ambulatory Visit: Payer: 59 | Admitting: Internal Medicine

## 2012-05-17 ENCOUNTER — Telehealth: Payer: Self-pay | Admitting: Internal Medicine

## 2012-05-17 ENCOUNTER — Encounter: Payer: Self-pay | Admitting: Internal Medicine

## 2012-05-17 ENCOUNTER — Ambulatory Visit (INDEPENDENT_AMBULATORY_CARE_PROVIDER_SITE_OTHER): Payer: 59 | Admitting: Internal Medicine

## 2012-05-17 VITALS — BP 130/78 | HR 90 | Temp 98.1°F | Wt 201.6 lb

## 2012-05-17 DIAGNOSIS — IMO0002 Reserved for concepts with insufficient information to code with codable children: Secondary | ICD-10-CM

## 2012-05-17 DIAGNOSIS — M5414 Radiculopathy, thoracic region: Secondary | ICD-10-CM

## 2012-05-17 MED ORDER — TRAMADOL HCL 50 MG PO TABS: 50.0000 mg | ORAL_TABLET | Freq: Four times a day (QID) | ORAL | Status: DC | PRN

## 2012-05-17 MED ORDER — CYCLOBENZAPRINE HCL 5 MG PO TABS: ORAL_TABLET | ORAL | Status: DC

## 2012-05-17 NOTE — Progress Notes (Signed)
Subjective:    Patient ID: Kevin Ochoa, male    DOB: [birth year], 66 y.o.   MRN: [id number]  HPI BACK PAIN: He began to have "spasming" and burning pain in the left posterior thorax 05/13/12 after sitting @ his computer for a period of time. There was no other trauma, trigger, or injury. The pain does radiate toward the spine. It has been worse when sleeping in the left lateral decubitus position or with rotation of the thorax to the left. Sleeping on a heating pad was of no benefit; arthritis strength Tylenol appeared to be of some benefit Past medical history/family history/social history were all reviewed and updated. Pertinent data: Multiple foot surgeries; no history of back surgery. His mother had osteoarthritis.    Review of Systems Fecal/urinary incontinence: no Numbness/Weakness: no Fever/chills/sweats: no  Unexplained weight loss: no  h/o cancer/immunosuppression: no  PMH of osteoporosis or chronic steroid use:no  He denies chest pain, shortness of breath, cough, or hemoptysis. He denies hematuria, pyuria, or dysuria              Objective:   Physical Exam Gen.:  well-nourished in appearance; uncomfortable. Alert, appropriate and cooperative throughout exam.  Neck: No deformities, masses, or tenderness noted. Range of motion normal. Lungs: Normal respiratory effort; chest expands symmetrically. Lungs are clear to auscultation without rales, wheezes, or increased work of breathing.  Chest: Some discomfort to compression of the left posterior chest  Heart: Normal rate and rhythm. Accentuated S1 and S2. No gallop, click, or rub. No murmur. Abdomen: Bowel sounds normal; abdomen soft and nontender. No masses, organomegaly .Ventral hernia noted.                                                                                   Musculoskeletal/extremities: No deformity or scoliosis noted of  the thoracic or lumbar spine; but there is some asymmetry of the posterior thoracic  musculature suggesting occult scoliosis.  No clubbing, cyanosis, edema, or deformity noted except post op feet changes. Range of motion  normal .Tone & strength  Normal.Jointsof hands normal. Nail health  good. He exhibits discomfort lying flat and sitting up. Straight leg raising is negative bilaterally. Homans sign is negative Vascular: Carotid, radial artery, dorsalis pedis and  posterior tibial pulses are full and equal. No bruits present. Neurologic: Alert and oriented x3. Deep tendon reflexes symmetrical but 0-1/2+.          Skin: Intact without suspicious lesions or rashes. Lymph: No cervical, axillary lymphadenopathy present. Psych: Mood and affect are normal. Normally interactive                                                                                         Assessment & Plan:  #1 left posterior thoracic chest pain in the context of subclinical scoliosis. Thoracic radicular pain  suspect  Plan: See medications. If symptoms persist or progress; imaging indicated.

## 2012-05-17 NOTE — Telephone Encounter (Signed)
Appt schedule

## 2012-05-17 NOTE — Telephone Encounter (Signed)
Caller: Kevin Ochoa/Patient; PCP: Marga Melnick; CB#: [phone];  Call regarding Back Pain; sx started 05/15/12; pinching like feeling; taking Tylenol Arthritis and heating pad and has helped some; pain is located in the upper back by shoulder blades;  not sleeping well from this pain; Triaged per Back Symptoms Guideline; See in 72 hr d/t mild moderate pain in the back with normal activity and not responding to home care; pt requests appt for today if possible; appt made for 4:30pm today  Dr Quintella Reichert; will comply

## 2012-05-17 NOTE — Patient Instructions (Addendum)
The best exercises for the low back include freestyle swimming, stretch aerobics, and yoga. 

## 2012-06-24 ENCOUNTER — Other Ambulatory Visit: Payer: Self-pay | Admitting: Internal Medicine

## 2012-07-31 ENCOUNTER — Other Ambulatory Visit: Payer: Self-pay | Admitting: Internal Medicine

## 2012-08-01 ENCOUNTER — Other Ambulatory Visit: Payer: Self-pay | Admitting: General Practice

## 2012-08-01 MED ORDER — SIMVASTATIN 40 MG PO TABS: 40.0000 mg | ORAL_TABLET | Freq: Every day | ORAL | Status: DC

## 2012-08-13 ENCOUNTER — Encounter: Payer: Self-pay | Admitting: Gastroenterology

## 2012-10-02 ENCOUNTER — Other Ambulatory Visit: Payer: Self-pay | Admitting: Internal Medicine

## 2012-10-02 NOTE — Telephone Encounter (Signed)
Rx sent.    MW 

## 2012-10-11 ENCOUNTER — Encounter: Payer: Self-pay | Admitting: Gastroenterology

## 2012-12-29 ENCOUNTER — Other Ambulatory Visit: Payer: Self-pay | Admitting: Internal Medicine

## 2013-05-05 ENCOUNTER — Telehealth: Payer: Self-pay | Admitting: Internal Medicine

## 2013-05-05 NOTE — Telephone Encounter (Signed)
Received medical records from [facility]

## 2013-05-06 ENCOUNTER — Ambulatory Visit (INDEPENDENT_AMBULATORY_CARE_PROVIDER_SITE_OTHER): Payer: 59 | Admitting: Internal Medicine

## 2013-05-06 ENCOUNTER — Encounter: Payer: Self-pay | Admitting: Internal Medicine

## 2013-05-06 VITALS — BP 128/80 | HR 70 | Temp 98.3°F | Wt 203.0 lb

## 2013-05-06 DIAGNOSIS — J309 Allergic rhinitis, unspecified: Secondary | ICD-10-CM

## 2013-05-06 DIAGNOSIS — Z860100 Personal history of colon polyps, unspecified: Secondary | ICD-10-CM

## 2013-05-06 DIAGNOSIS — Z6379 Other stressful life events affecting family and household: Secondary | ICD-10-CM

## 2013-05-06 DIAGNOSIS — I10 Essential (primary) hypertension: Secondary | ICD-10-CM

## 2013-05-06 MED ORDER — FLUTICASONE PROPIONATE 50 MCG/ACT NA SUSP: NASAL | Status: DC

## 2013-05-06 NOTE — Patient Instructions (Addendum)
Plain Mucinex (NOT D) for thick secretions ;force NON dairy fluids .   Nasal cleansing in the shower as discussed with lather of mild shampoo.After 10 seconds wash off lather while  exhaling through nostrils. Make sure that all residual soap is removed to prevent irritation.  Fluticasone 1 spray in each nostril twice a day as needed. Use the "crossover" technique into opposite nostril spraying toward opposite ear @ 45 degree angle, not straight up into nostril.  Use a Neti pot daily only  as needed for significant sinus congestion; going from open side to congested side . Plain Allegra (NOT D )  160 daily , Loratidine 10 mg , OR Zyrtec 10 mg @ bedtime  as needed for itchy eyes & sneezing.   Minimal Blood Pressure Goal= AVERAGE < 140/90;  Ideal is an AVERAGE < 135/85. This AVERAGE should be calculated from @ least 5-7 BP readings taken @ different times of day on different days of week. You should not respond to isolated BP readings , but rather the AVERAGE for that week .Please bring your  blood pressure cuff to office visits to verify that it is reliable.It  can also be checked against the blood pressure device at the pharmacy. Finger or wrist cuffs are not dependable; an arm cuff is. Cardiovascular exercise, this can be as simple a program as walking, is recommended 30-45 minutes 3-4 times per week. If you're not exercising you should take 6-8 weeks to build up to this level.

## 2013-05-06 NOTE — Progress Notes (Signed)
  Subjective:    Patient ID: Kevin Ochoa, male    DOB: [birth year], 67 y.o.   MRN: [id number]  HPI HYPERTENSION follow-up: BP was  140/110 on 05/04/13 upon awakening , prompting UC visit.EKG done & Bystolic added.  Home blood pressure range 121/79-135/93 on Bystolic  Patient is compliant with medications  No adverse effects noted from medication  No exercise program   Specific dietary program of low-fat & low-salt diet           Review of Systems  No chest pain, palpitations, dyspnea, claudication,edema or paroxysmal nocturnal dyspnea described. Some postural lightheadedness. No epistaxis or syncope.  He describes some nervousness and being shaky. He also has some malaise.  He describes increased family stresses related to relocating an elderly parents to [place] from [place] and taking care of an autistic daughter as a single parent.  He has extrinsic symptoms of itchy eyes, sneezing, postnasal drainage. He has not been taking a decongestant.      Objective:   Physical Exam Appears healthy and well-nourished & in no acute distress  No carotid bruits are present.No neck pain distention present at 10 - 15 degrees. Thyroid normal to palpation  Heart rhythm and rate are normal with no significant murmurs or gallops. Accentuated  S2  Chest is clear with no increased work of breathing  There is no evidence of aortic aneurysm or renal artery bruits  Abdomen soft with no organomegaly or masses. No HJR  Foot  Clubbing. No  cyanosis or edema present.  Pedal pulses are intact but decreased DPP  No ischemic skin changes are present . Toes & nails chronically deformed  Alert and oriented. Strength, tone, DTRs reflexes normal         Assessment & Plan:  #1 HTN #2 exogenous stress; meds declined #3 extrinsic rhinitis Plan : see orders

## 2013-06-29 ENCOUNTER — Other Ambulatory Visit: Payer: Self-pay | Admitting: Neurology

## 2013-07-17 ENCOUNTER — Encounter: Payer: Self-pay | Admitting: Gastroenterology

## 2013-07-21 ENCOUNTER — Other Ambulatory Visit: Payer: Self-pay | Admitting: Internal Medicine

## 2013-07-23 ENCOUNTER — Telehealth: Payer: Self-pay | Admitting: General Practice

## 2013-07-23 ENCOUNTER — Other Ambulatory Visit: Payer: Self-pay | Admitting: *Deleted

## 2013-07-23 MED ORDER — SIMVASTATIN 40 MG PO TABS: ORAL_TABLET | ORAL | Status: DC

## 2013-07-23 MED ORDER — CLOPIDOGREL BISULFATE 75 MG PO TABS: ORAL_TABLET | ORAL | Status: DC

## 2013-07-23 NOTE — Telephone Encounter (Signed)
Pt left a message on the triage line stating that he needs refills on medications due to him being out of town for 4-5 days. Called pt back and LMOVM asking which meds he needed filled.

## 2013-07-23 NOTE — Telephone Encounter (Signed)
Rx was refilled for simvastatin 40 mg and Plavix 75 mg.  Also patient has a appt. For October 2014.  ag cma

## 2013-07-29 NOTE — Telephone Encounter (Signed)
Called pt again. No answer. Closing encounter until pt returns call.

## 2013-09-08 ENCOUNTER — Telehealth: Payer: Self-pay

## 2013-09-08 NOTE — Telephone Encounter (Addendum)
LM for CB Unable to reach prior to visit   

## 2013-09-10 ENCOUNTER — Encounter: Payer: Self-pay | Admitting: Internal Medicine

## 2013-09-10 ENCOUNTER — Ambulatory Visit (INDEPENDENT_AMBULATORY_CARE_PROVIDER_SITE_OTHER): Payer: 59 | Admitting: Internal Medicine

## 2013-09-10 VITALS — BP 155/99 | HR 67 | Temp 98.4°F | Resp 16 | Ht 73.0 in | Wt 208.2 lb

## 2013-09-10 DIAGNOSIS — E782 Mixed hyperlipidemia: Secondary | ICD-10-CM

## 2013-09-10 DIAGNOSIS — I10 Essential (primary) hypertension: Secondary | ICD-10-CM

## 2013-09-10 DIAGNOSIS — Z0000 Encounter for general adult medical examination without abnormal findings: Secondary | ICD-10-CM

## 2013-09-10 DIAGNOSIS — N200 Calculus of kidney: Secondary | ICD-10-CM | POA: Insufficient documentation

## 2013-09-10 DIAGNOSIS — E269 Hyperaldosteronism, unspecified: Secondary | ICD-10-CM

## 2013-09-10 DIAGNOSIS — Z86010 Personal history of colonic polyps: Secondary | ICD-10-CM

## 2013-09-10 MED ORDER — SIMVASTATIN 40 MG PO TABS: ORAL_TABLET | ORAL | Status: DC

## 2013-09-10 MED ORDER — SPIRONOLACTONE 25 MG PO TABS: 25.0000 mg | ORAL_TABLET | Freq: Every day | ORAL | Status: DC

## 2013-09-10 MED ORDER — NEBIVOLOL HCL 10 MG PO TABS: 10.0000 mg | ORAL_TABLET | Freq: Every day | ORAL | Status: DC

## 2013-09-10 NOTE — Progress Notes (Signed)
Subjective:    Patient ID: Kevin Ochoa, male    DOB: [birth year], 67 y.o.   MRN: [id number]  HPI Medicare Wellness Visit: Psychosocial and medical history were reviewed as required by Medicare (abuse, antisocial behavior risk, forearm risk). Social history: Caffeine: 8-16 oz tea/ day  , Alcohol: rare wine , Tobacco use: quit 1974 Exercise:see below Personal safety/fall risk: due to instability R knee Limitations of activities of daily living: decreased walking ; avoids stairs Seatbelt smoke alarm use: yes Healthcare Power of Attorney/Living Will status: no HCPOA Ophthalmologic exam status:current Hearing evaluation status: not current Orientation: Oriented X3 Memory and recall: good Spelling  testing: good Depression/anxiety assessment: denied Foreign travel history:[place] in 1990s Immunization status the shingles/bleeding/pneumonia/tetanus: Flu 10/21; PNA needed Transfusion history:no Preventive health care maintenance status: Colonoscopy as per protocol/standard care:current Dental care:every 6 -12 mos Chart reviewed and updated. Active issues reviewed and addressed.    Review of Systems He is on a heart healthy diet; he is not exercising due to knee .He denies chest pain, palpitations, dyspnea, or claudication.  Family history is negative for premature coronary disease. Advanced cholesterol testing reveals his LDL goal is less than 100. There is medication compliance with the statin. Significant abdominal symptoms or memory deficit. Some weakness w/o  myalgias .  BP @ home 117-130 /83 on Spironolactone bid; higher on qd     Objective:   Physical Exam  Gen.:  well-nourished in appearance. Alert, appropriate and cooperative throughout exam.  Head: Normocephalic without obvious abnormalities;  goatee  Eyes: No corneal or conjunctival inflammation noted. rcus senilis.Pupils equal round reactive to light and accommodation. Arcus senilis. Extraocular motion intact.  Ears:  External  ear exam reveals no significant lesions or deformities. Canals clear .TMs normal. Hearing is grossly normal bilaterally. Nose: External nasal exam reveals no deformity or inflammation. Nasal mucosa are pink and moist. No lesions or exudates noted.  Mouth: Oral mucosa and oropharynx reveal no lesions or exudates. Upper & lower partial. Neck: No deformities, masses, or tenderness noted. Range of motion & Thyroid normal. Lungs: Normal respiratory effort; chest expands symmetrically. Lungs are clear to auscultation without rales, wheezes, or increased work of breathing. Heart: Normal rate and rhythm. Normal S1 ; loud  S2. No gallop, click, or rub. No murmur. Abdomen: Bowel sounds normal; abdomen soft and nontender. No masses, organomegaly or hernias noted. Genitalia: Genitalia normal except for left varices. Prostate is normal without enlargement, asymmetry, nodularity, or induration.                                   Musculoskeletal/extremities: There is some asymmetry of the posterior thoracic musculature suggesting occult scoliosis. No clubbing, cyanosis, or edema.Post op feet deformities noted. Range of motion normal .Tone & strength  Normal. Joints normal . Nail health good. Crepitus R > L knee Able to lie down & sit up w/o help. Negative SLR bilaterally Vascular: Carotid, radial artery, dorsalis pedis and  posterior tibial pulses are  Equal. Decreased pedal pulses. No bruits present. Neurologic: Alert and oriented x3. Deep tendon reflexes symmetrical but 1/2 + @ knees          Skin: Intact without suspicious lesions or rashes. Lymph: No cervical, axillary, or inguinal lymphadenopathy present. Psych: Mood and affect are normal. Normally interactive  Assessment & Plan:  #1 Medicare Wellness Exam; criteria met ; data entered #2 Problem List/Diagnoses reviewed Plan:  Assessments made/ Orders  entered

## 2013-09-10 NOTE — Patient Instructions (Addendum)
Levitra 1/2 -1 daily as needed  Minimal Blood Pressure Goal= AVERAGE < 140/90;  Ideal is an AVERAGE < 135/85. This AVERAGE should be calculated from @ least 5-7 BP readings taken @ different times of day on different days of week. You should not respond to isolated BP readings , but rather the AVERAGE for that week .Please bring your  blood pressure cuff to office visits to verify that it is reliable.It  can also be checked against the blood pressure device at the pharmacy. Finger or wrist cuffs are not dependable; an arm cuff is.

## 2013-09-11 LAB — HEPATIC FUNCTION PANEL
Albumin: 4.5 g/dL (ref 3.5–5.2)
Bilirubin, Direct: 0.1 mg/dL (ref 0.0–0.3)
Total Protein: 7.3 g/dL (ref 6.0–8.3)

## 2013-09-11 LAB — CBC WITH DIFFERENTIAL/PLATELET
Basophils Relative: 0.1 % (ref 0.0–3.0)
Eosinophils Absolute: 0.3 10*3/uL (ref 0.0–0.7)
Lymphs Abs: 2.1 10*3/uL (ref 0.7–4.0)
MCHC: 34.3 g/dL (ref 30.0–36.0)
MCV: 88.6 fl (ref 78.0–100.0)
Monocytes Absolute: 0.6 10*3/uL (ref 0.1–1.0)
Neutro Abs: 3.8 10*3/uL (ref 1.4–7.7)
Neutrophils Relative %: 55.9 % (ref 43.0–77.0)
RBC: 4.38 Mil/uL (ref 4.22–5.81)

## 2013-09-11 LAB — LIPID PANEL
Cholesterol: 184 mg/dL (ref 0–200)
HDL: 44.8 mg/dL (ref >39.00)
Triglycerides: 157 mg/dL — ABNORMAL HIGH (ref 0.0–149.0)

## 2013-09-11 LAB — BASIC METABOLIC PANEL
CO2: 30 mEq/L (ref 19–32)
Chloride: 105 mEq/L (ref 96–112)
Creatinine, Ser: 1.4 mg/dL (ref 0.4–1.5)

## 2013-09-18 ENCOUNTER — Other Ambulatory Visit: Payer: Self-pay | Admitting: Internal Medicine

## 2013-09-19 ENCOUNTER — Other Ambulatory Visit: Payer: Self-pay | Admitting: *Deleted

## 2013-09-19 ENCOUNTER — Telehealth: Payer: Self-pay | Admitting: *Deleted

## 2013-09-19 ENCOUNTER — Encounter: Payer: Self-pay | Admitting: *Deleted

## 2013-09-19 MED ORDER — TRAMADOL HCL 50 MG PO TABS
50.0000 mg | ORAL_TABLET | Freq: Four times a day (QID) | ORAL | Status: DC | PRN
Start: 2013-09-19 — End: 2014-03-19

## 2013-09-19 NOTE — Telephone Encounter (Signed)
Tramadol refilled.

## 2013-09-19 NOTE — Telephone Encounter (Signed)
Called and left message for patient to inform him that Tramadol script is ready for pick up at our front desk. Medication contract needs to be signed and UDS 

## 2013-09-19 NOTE — Telephone Encounter (Signed)
Tramadol 50 mg Last OV: 09/10/2013 Last Refill: 05/17/2012 No contract on file

## 2013-09-19 NOTE — Telephone Encounter (Signed)
OK X1, R X1 

## 2014-03-16 ENCOUNTER — Telehealth: Payer: Self-pay | Admitting: *Deleted

## 2014-03-16 NOTE — Telephone Encounter (Signed)
He needs OV

## 2014-03-16 NOTE — Telephone Encounter (Signed)
Pt called states he discovered blood in his stool.  Please advise

## 2014-03-16 NOTE — Telephone Encounter (Signed)
Spoke with pt advised of MDs message.  Pt scheduled appoint for 4.29.15

## 2014-03-18 ENCOUNTER — Ambulatory Visit: Payer: Medicare Other | Admitting: Internal Medicine

## 2014-03-19 ENCOUNTER — Encounter: Payer: Self-pay | Admitting: Internal Medicine

## 2014-03-19 ENCOUNTER — Ambulatory Visit (INDEPENDENT_AMBULATORY_CARE_PROVIDER_SITE_OTHER): Payer: 59 | Admitting: Internal Medicine

## 2014-03-19 ENCOUNTER — Other Ambulatory Visit: Payer: Self-pay | Admitting: Internal Medicine

## 2014-03-19 ENCOUNTER — Other Ambulatory Visit (INDEPENDENT_AMBULATORY_CARE_PROVIDER_SITE_OTHER): Payer: 59

## 2014-03-19 VITALS — BP 138/100 | HR 58 | Temp 98.1°F | Resp 12 | Wt 211.0 lb

## 2014-03-19 DIAGNOSIS — K625 Hemorrhage of anus and rectum: Secondary | ICD-10-CM

## 2014-03-19 DIAGNOSIS — R269 Unspecified abnormalities of gait and mobility: Secondary | ICD-10-CM

## 2014-03-19 DIAGNOSIS — I10 Essential (primary) hypertension: Secondary | ICD-10-CM

## 2014-03-19 DIAGNOSIS — Z860100 Personal history of colon polyps, unspecified: Secondary | ICD-10-CM

## 2014-03-19 LAB — CBC WITH DIFFERENTIAL/PLATELET
BASOS ABS: 0 10*3/uL (ref 0.0–0.1)
Basophils Relative: 0.4 % (ref 0.0–3.0)
EOS ABS: 0.4 10*3/uL (ref 0.0–0.7)
Eosinophils Relative: 5 % (ref 0.0–5.0)
HEMATOCRIT: 41.9 % (ref 39.0–52.0)
HEMOGLOBIN: 14.2 g/dL (ref 13.0–17.0)
LYMPHS ABS: 2.3 10*3/uL (ref 0.7–4.0)
Lymphocytes Relative: 30.2 % (ref 12.0–46.0)
MCHC: 33.9 g/dL (ref 30.0–36.0)
MCV: 90.3 fl (ref 78.0–100.0)
Monocytes Absolute: 0.7 10*3/uL (ref 0.1–1.0)
Monocytes Relative: 9 % (ref 3.0–12.0)
NEUTROS ABS: 4.1 10*3/uL (ref 1.4–7.7)
Neutrophils Relative %: 55.4 % (ref 43.0–77.0)
Platelets: 270 10*3/uL (ref 150.0–400.0)
RBC: 4.64 Mil/uL (ref 4.22–5.81)
RDW: 13.6 % (ref 11.5–14.6)
WBC: 7.5 10*3/uL (ref 4.5–10.5)

## 2014-03-19 LAB — BASIC METABOLIC PANEL
BUN: 20 mg/dL (ref 6–23)
CALCIUM: 9.7 mg/dL (ref 8.4–10.5)
CO2: 28 mEq/L (ref 19–32)
Chloride: 106 mEq/L (ref 96–112)
Creatinine, Ser: 1.5 mg/dL (ref 0.4–1.5)
GFR: 59.98 mL/min — ABNORMAL LOW (ref >60.00)
Glucose, Bld: 93 mg/dL (ref 70–99)
Potassium: 4.6 mEq/L (ref 3.5–5.1)
Sodium: 141 mEq/L (ref 135–145)

## 2014-03-19 MED ORDER — SPIRONOLACTONE 25 MG PO TABS: 25.0000 mg | ORAL_TABLET | Freq: Two times a day (BID) | ORAL | Status: DC

## 2014-03-19 NOTE — Addendum Note (Signed)
Addended by: Noreene LarssonANDREWS, Stepanie Graver R on: 03/19/2014 09:35 AM   Modules accepted: Orders

## 2014-03-19 NOTE — Progress Notes (Signed)
Pre visit review using our clinic review tool, if applicable. No additional management support is needed unless otherwise documented below in the visit note. 

## 2014-03-19 NOTE — Progress Notes (Signed)
   Subjective:    Patient ID: Kevin Ochoa, male    DOB: 01/07/1946, 68 y.o.   MRN: [id number]  HPI   Symptoms began as bright red blood on the tissue after having a bowel movement 03/14/14 . The stool was not large and there was no associated pain. There was no blood on the toilet water.   He was found to have two adenomatous colon polyps in 2008; surveillance colonoscopy in 2013 was deferred until 2016 because of Plavix therapy. Actually he discontinued the Plavix on his own in 2015. It had been prescribed by a neurologist for possible cerebellar TIAs as explanation of his gait instability. Review of Systems  He denies epistaxis, hemoptysis, hematuria, melena, or rectal bleeding.He has no unexplained weight loss, dysphagia, or abdominal pain. He has no abnormal bruising or bleeding. He has no difficulty stopping bleeding with injury.     Objective:   Physical Exam General appearance is one of good health and nourishment w/o distress.  Eyes: No conjunctival inflammation or scleral icterus is present. Arcus  Oral exam: Dental hygiene is good; lips and gums are healthy appearing.There is no oropharyngeal erythema or exudate noted.   Heart:  Normal rate and regular rhythm. S1 ;accentuated  S2  without gallop, murmur, click, rub or other extra sounds     Lungs:Chest clear to auscultation; no wheezes, rhonchi,rales ,or rubs present.No increased work of breathing.   Abdomen: bowel sounds normal, soft and non-tender without masses, organomegaly or hernias noted.  No guarding or rebound . No tenderness over the flanks to percussion DRE & FOB normal/negative  Musculoskeletal: Able to lie flat and sit up without help. Negative straight leg raising bilaterally. Stable MS deformities  Skin:Warm & dry.  Intact without suspicious lesions or rashes ; no jaundice or tenting  Lymphatic: No lymphadenopathy is noted about the head, neck, axilla               Assessment & Plan:  #1 rectal  bleeding #2 PMH colon polyps  #3 PRIOR Plavix therapy See orders

## 2014-03-19 NOTE — Patient Instructions (Signed)
Your next office appointment will be determined based upon review of your pending labs. Those instructions will be transmitted to you through My Chart  Followup as needed for your acute issue. Please report any significant change in your symptoms.The GI referral will be scheduled and you'll be notified of the time.

## 2014-03-27 ENCOUNTER — Encounter: Payer: Self-pay | Admitting: Internal Medicine

## 2014-04-23 ENCOUNTER — Ambulatory Visit: Payer: 59 | Admitting: Internal Medicine

## 2014-05-20 ENCOUNTER — Encounter: Payer: Self-pay | Admitting: Internal Medicine

## 2014-05-26 ENCOUNTER — Encounter: Payer: Self-pay | Admitting: Internal Medicine

## 2014-05-26 ENCOUNTER — Ambulatory Visit (INDEPENDENT_AMBULATORY_CARE_PROVIDER_SITE_OTHER): Payer: Medicare Other | Admitting: Internal Medicine

## 2014-05-26 VITALS — BP 122/84 | HR 58 | Ht 73.0 in | Wt 209.0 lb

## 2014-05-26 DIAGNOSIS — K625 Hemorrhage of anus and rectum: Secondary | ICD-10-CM

## 2014-05-26 DIAGNOSIS — Z1211 Encounter for screening for malignant neoplasm of colon: Secondary | ICD-10-CM

## 2014-05-26 DIAGNOSIS — Z86010 Personal history of colonic polyps: Secondary | ICD-10-CM

## 2014-05-26 DIAGNOSIS — L290 Pruritus ani: Secondary | ICD-10-CM

## 2014-05-26 MED ORDER — PEG-KCL-NACL-NASULF-NA ASC-C 100 G PO SOLR: 1.0000 | Freq: Once | ORAL | Status: DC

## 2014-05-26 NOTE — Patient Instructions (Signed)
You have been scheduled for a colonoscopy. Please follow written instructions given to you at your visit today.  Please pick up your prep kit at the pharmacy within the next 1-3 days. If you use inhalers (even only as needed), please bring them with you on the day of your procedure. Your physician has requested that you go to www.startemmi.com and enter the access code given to you at your visit today. This web site gives a general overview about your procedure. However, you should still follow specific instructions given to you by our office regarding your preparation for the procedure.

## 2014-05-26 NOTE — Progress Notes (Signed)
Patient ID: Kevin Ochoa, male   DOB: [birth year], 68 y.o.   MRN: [id number] HPI: [name] is a 68 year old male previously known to Dr. Jarold MottoPatterson from screening colonoscopy performed in 2008 with removal of adenomatous colon polyps, history of hypertension and hyperlipidemia who is seen in consultation at the request of Dr. Alwyn RenHopper to evaluate rectal bleeding. He is here alone today. He reports over the last several months he's had intermittent bright red blood per rectum. This is noticed with bowel movement history to blood on the stool but also with wiping. He's had some perianal irritation and itching. Symptoms seem to worsen after digital rectal exam performed by primary care. He used over-the-counter Preparation H which helped his symptoms and currently he denies perianal irritation, pain or recent rectal bleeding. He reports normal soft bowel movements occurring regularly each day. He denies significant diarrhea or constipation. No narrow caliber stools. Denies melena. No abdominal pain. Good appetite. No nausea or vomiting. No dysphagia or odynophagia. No heartburn. No weight loss. He does report a family history of "colitis" in his mother but denies a family history of GI tract malignancy. He previously was taking clopidogrel but has been off this medication for at least a year.  Colonoscopy in 2008 was his first and only colonoscopy.  Past Medical History  Diagnosis Date  . Hyperlipidemia   . Hypertension   . Allergy   . calculi 2001    passed spontaneously    Past Surgical History  Procedure Laterality Date  . Foot surgery      X11 in context of clubbing  . Colonoscopy with plypectomy  2008        Outpatient Prescriptions Prior to Visit  Medication Sig Dispense Refill  . nebivolol (BYSTOLIC) 10 MG tablet Take 1 tablet (10 mg total) by mouth daily.  90 tablet  3  . simvastatin (ZOCOR) 40 MG tablet Take one tablet by mouth at bedtime (labs overdue)  90 tablet  3  . spironolactone  (ALDACTONE) 25 MG tablet Take 1 tablet (25 mg total) by mouth 2 (two) times daily.  180 tablet  1   No facility-administered medications prior to visit.    Allergies  Allergen Reactions  . Aspirin     REACTION: hives Because of a history of documented adverse serious drug reaction;Medi Alert bracelet  is recommended    Family History  Problem Relation Age of Onset  . Diabetes Maternal Grandfather   . Breast cancer Maternal Grandmother   . Hyperlipidemia Mother   . Hypertension Mother   . Arthritis Mother     OA  . Stroke Neg Hx   . Heart disease Neg Hx     History  Substance Use Topics  . Smoking status: Former Smoker    Quit date: 11/20/1972  . Smokeless tobacco: Not on file     Comment: smoked  1966- 1974, < 1 ppd  . Alcohol Use: Yes     Comment: rare wine    ROS: As per history of present illness, otherwise negative  BP 122/84  Pulse 58  Ht 6\' 1"  (1.854 m)  Wt 209 lb (94.802 kg)  BMI 27.58 kg/m2 Constitutional: Well-developed and well-nourished. No distress. HEENT: Normocephalic and atraumatic. Oropharynx is clear and moist. No oropharyngeal exudate. Conjunctivae are normal.  No scleral icterus. Neck: Neck supple. Trachea midline. Cardiovascular: Normal rate, regular rhythm and intact distal pulses. No M/R/G Pulmonary/chest: Effort normal and breath sounds normal. No wheezing, rales or rhonchi. Abdominal: Soft, obese,  nontender, nondistended. Bowel sounds active throughout. Extremities: no clubbing, cyanosis, or edema Lymphadenopathy: No cervical adenopathy noted. Neurological: Alert and oriented to person place and time. Skin: Skin is warm and dry. No rashes noted. Psychiatric: Normal mood and affect. Behavior is normal.  RELEVANT LABS AND IMAGING: CBC    Component Value Date/Time   WBC 7.5 03/19/2014 0931   RBC 4.64 03/19/2014 0931   HGB 14.2 03/19/2014 0931   HCT 41.9 03/19/2014 0931   PLT 270.0 03/19/2014 0931   MCV 90.3 03/19/2014 0931   MCHC 33.9  03/19/2014 0931   RDW 13.6 03/19/2014 0931   LYMPHSABS 2.3 03/19/2014 0931   MONOABS 0.7 03/19/2014 0931   EOSABS 0.4 03/19/2014 0931   BASOSABS 0.0 03/19/2014 0931    CMP     Component Value Date/Time   NA 141 03/19/2014 0931   K 4.6 03/19/2014 0931   CL 106 03/19/2014 0931   CO2 28 03/19/2014 0931   GLUCOSE 93 03/19/2014 0931   BUN 20 03/19/2014 0931   CREATININE 1.5 03/19/2014 0931   CALCIUM 9.7 03/19/2014 0931   PROT 7.3 09/10/2013 1445   ALBUMIN 4.5 09/10/2013 1445   AST 20 09/10/2013 1445   ALT 24 09/10/2013 1445   ALKPHOS 51 09/10/2013 1445   BILITOT 0.6 09/10/2013 1445   GFRNONAA 60.22 03/29/2010 1236   GFRAA 97 11/17/2008 0844    ASSESSMENT/PLAN:  68 year old male previously known to Dr. Jarold MottoPatterson from screening colonoscopy performed in 2008 with removal of adenomatous colon polyps, history of hypertension and hyperlipidemia who is seen in consultation at the request of Dr. Alwyn RenHopper to evaluate rectal bleeding.  1.  Hx of adenomatous colon polyps/rectal bleeding with perianal irritation -- certainly has rectal bleeding and perianal irritation sounds hemorrhoidal in nature, but given his history of adenomatous polyps and bleeding, I recommended repeating the colonoscopy at this time. We discussed colonoscopy including the risks and benefits and he is agreeable to proceed. Per the record, Dr. Jarold MottoPatterson recommended repeat colonoscopy 3 years after his initial screening which would've been 2011. If hemorrhoids are seen at the time of colonoscopy, medical therapy can be recommended.  Further recommendations after colonoscopy

## 2014-06-04 ENCOUNTER — Ambulatory Visit (AMBULATORY_SURGERY_CENTER): Payer: Medicare Other | Admitting: Internal Medicine

## 2014-06-04 ENCOUNTER — Encounter: Payer: Self-pay | Admitting: Internal Medicine

## 2014-06-04 VITALS — BP 120/54 | HR 61 | Temp 97.4°F | Resp 15 | Ht 73.0 in | Wt 209.0 lb

## 2014-06-04 DIAGNOSIS — Z86010 Personal history of colonic polyps: Secondary | ICD-10-CM

## 2014-06-04 DIAGNOSIS — Z1211 Encounter for screening for malignant neoplasm of colon: Secondary | ICD-10-CM

## 2014-06-04 MED ORDER — SODIUM CHLORIDE 0.9 % IV SOLN: 500.0000 mL | INTRAVENOUS | Status: DC

## 2014-06-04 NOTE — Progress Notes (Signed)
Report to PACU, RN, vss, BBS= Clear.  

## 2014-06-04 NOTE — Op Note (Signed)
[facility] [address] [place], [id number]   COLONOSCOPY PROCEDURE REPORT  PATIENT: Kevin Ochoa, [name]  MR#: [id number] BIRTHDATE: [birth year] , 68  yrs. old GENDER: Male ENDOSCOPIST: Beverley FiedlerJay M Lovada Barwick, MD PROCEDURE DATE:  06/04/2014 PROCEDURE:   Colonoscopy, surveillance First Screening Colonoscopy - Avg.  risk and is 50 yrs.  old or older - No.  Prior Negative Screening - Now for repeat screening. N/A  History of Adenoma - Now for follow-up colonoscopy & has been > or = to 3 yrs.  Yes hx of adenoma.  Has been 3 or more years since last colonoscopy.  Polyps Removed Today? No.  Recommend repeat exam, <10 yrs? Yes.  High risk (family or personal hx). ASA CLASS:   Class III INDICATIONS:elevated risk screening and Patient's personal history of adenomatous colon polyps. MEDICATIONS: MAC sedation, administered by CRNA and propofol (Diprivan) 230mg  IV  DESCRIPTION OF PROCEDURE:   After the risks benefits and alternatives of the procedure were thoroughly explained, informed consent was obtained.  A digital rectal exam revealed small external hemorrhoids.   The LB [id number]  endoscope was introduced through the anus and advanced to the cecum, which was identified by both the appendix and ileocecal valve. No adverse events experienced.   The quality of the prep was good, using MoviPrep  The instrument was then slowly withdrawn as the colon was fully examined.   COLON FINDINGS: A normal appearing cecum, ileocecal valve, and appendiceal orifice were identified.  The ascending, hepatic flexure, transverse, splenic flexure, descending, sigmoid colon and rectum appeared unremarkable.  No polyps or cancers were seen. Retroflexed views revealed small internal/external hemorrhoids. The time to cecum=2 minutes 31 seconds.  Withdrawal time=12 minutes 20 seconds.  The scope was withdrawn and the procedure completed.  COMPLICATIONS: There were no complications.  ENDOSCOPIC  IMPRESSION: Normal colon  RECOMMENDATIONS: 1.  Given your personal history of adenomatous (pre-cancerous) polyps, you will need a repeat colonoscopy in 5 years. 2.  Analpram can be used two to three times daily on occasion for external hemorrhoids as needed   eSigned:  Beverley FiedlerJay M Leanore Biggers, MD 06/04/2014 11:01 AM   cc: The Patient and Pecola LawlessWilliam F Hopper, MD

## 2014-06-04 NOTE — Patient Instructions (Signed)
YOU HAD AN ENDOSCOPIC PROCEDURE TODAY AT THE [facility]: Refer to the procedure report that was given to you for any specific questions about what was found during the examination.  If the procedure report does not answer your questions, please call your gastroenterologist to clarify.  If you requested that your care partner not be given the details of your procedure findings, then the procedure report has been included in a sealed envelope for you to review at your convenience later.  YOU SHOULD EXPECT: Some feelings of bloating in the abdomen. Passage of more gas than usual.  Walking can help get rid of the air that was put into your GI tract during the procedure and reduce the bloating. If you had a lower endoscopy (such as a colonoscopy or flexible sigmoidoscopy) you may notice spotting of blood in your stool or on the toilet paper. If you underwent a bowel prep for your procedure, then you may not have a normal bowel movement for a few days.  DIET: Your first meal following the procedure should be a light meal and then it is ok to progress to your normal diet.  A half-sandwich or bowl of soup is an example of a good first meal.  Heavy or fried foods are harder to digest and may make you feel nauseous or bloated.  Likewise meals heavy in dairy and vegetables can cause extra gas to form and this can also increase the bloating.  Drink plenty of fluids but you should avoid alcoholic beverages for 24 hours.  ACTIVITY: Your care partner should take you home directly after the procedure.  You should plan to take it easy, moving slowly for the rest of the day.  You can resume normal activity the day after the procedure however you should NOT DRIVE or use heavy machinery for 24 hours (because of the sedation medicines used during the test).    SYMPTOMS TO REPORT IMMEDIATELY: A gastroenterologist can be reached at any hour.  During normal business hours, 8:30 AM to 5:00 PM Monday through Friday,  call [phone].  After hours and on weekends, please call the GI answering service at [phone] who will take a message and have the physician on call contact you.   Following lower endoscopy (colonoscopy or flexible sigmoidoscopy):  Excessive amounts of blood in the stool  Significant tenderness or worsening of abdominal pains  Swelling of the abdomen that is new, acute  Fever of 100F or higher  FOLLOW UP: If any biopsies were taken you will be contacted by phone or by letter within the next 1-3 weeks.  Call your gastroenterologist if you have not heard about the biopsies in 3 weeks.  Our staff will call the home number listed on your records the next business day following your procedure to check on you and address any questions or concerns that you may have at that time regarding the information given to you following your procedure. This is a courtesy call and so if there is no answer at the home number and we have not heard from you through the emergency physician on call, we will assume that you have returned to your regular daily activities without incident.  SIGNATURES/CONFIDENTIALITY: You and/or your care partner have signed paperwork which will be entered into your electronic medical record.  These signatures attest to the fact that that the information above on your After Visit Summary has been reviewed and is understood.  Full responsibility of the confidentiality of this   discharge information lies with you and/or your care-partner.   Resume medications. Information given on hemorrhoids and high fiber diet with discharge instructions. 

## 2014-06-05 ENCOUNTER — Telehealth: Payer: Self-pay

## 2014-06-05 NOTE — Telephone Encounter (Signed)
  Follow up Call-  Call back number 06/04/2014  Post procedure Call Back phone  # [phone]  Permission to leave phone message Yes     Patient questions:  Do you have a fever, pain , or abdominal swelling? No. Pain Score  0 *  Have you tolerated food without any problems? Yes.    Have you been able to return to your normal activities? Yes.    Do you have any questions about your discharge instructions: Diet   No. Medications  No. Follow up visit  No.  Do you have questions or concerns about your Care? No.  Actions: * If pain score is 4 or above: No action needed, pain <4.  No problems per the pt. [name]

## 2014-06-08 ENCOUNTER — Telehealth: Payer: Self-pay | Admitting: Internal Medicine

## 2014-06-08 MED ORDER — HYDROCORTISONE ACE-PRAMOXINE 2.5-1 % RE CREA: 1.0000 "application " | TOPICAL_CREAM | Freq: Three times a day (TID) | RECTAL | Status: DC

## 2014-06-08 NOTE — Telephone Encounter (Signed)
Apologized and told patient that he can use Analpram per his last Colon report 2-3 x daily as needed. Told him I will send in a prescription for Analpram to his pharmacy. Patient wants to know if this will help with bleeding and irritation. I told him it will help with external hemorrhoids and if it does not help then to call our office back. Pt agreed.

## 2014-08-30 ENCOUNTER — Encounter (HOSPITAL_COMMUNITY): Payer: Self-pay | Admitting: Emergency Medicine

## 2014-08-30 ENCOUNTER — Emergency Department (HOSPITAL_COMMUNITY)
Admission: EM | Admit: 2014-08-30 | Discharge: 2014-08-30 | Disposition: A | Discharge status: Home / Self Care | Payer: Medicare Other | Attending: Emergency Medicine | Admitting: Emergency Medicine

## 2014-08-30 ENCOUNTER — Emergency Department (HOSPITAL_COMMUNITY): Payer: Medicare Other

## 2014-08-30 DIAGNOSIS — Z87442 Personal history of urinary calculi: Secondary | ICD-10-CM | POA: Insufficient documentation

## 2014-08-30 DIAGNOSIS — Z7951 Long term (current) use of inhaled steroids: Secondary | ICD-10-CM | POA: Insufficient documentation

## 2014-08-30 DIAGNOSIS — E785 Hyperlipidemia, unspecified: Secondary | ICD-10-CM | POA: Diagnosis not present

## 2014-08-30 DIAGNOSIS — M1990 Unspecified osteoarthritis, unspecified site: Secondary | ICD-10-CM | POA: Diagnosis not present

## 2014-08-30 DIAGNOSIS — H269 Unspecified cataract: Secondary | ICD-10-CM | POA: Diagnosis not present

## 2014-08-30 DIAGNOSIS — Z79899 Other long term (current) drug therapy: Secondary | ICD-10-CM | POA: Diagnosis not present

## 2014-08-30 DIAGNOSIS — R079 Chest pain, unspecified: Secondary | ICD-10-CM | POA: Diagnosis present

## 2014-08-30 DIAGNOSIS — I10 Essential (primary) hypertension: Secondary | ICD-10-CM | POA: Diagnosis not present

## 2014-08-30 DIAGNOSIS — R531 Weakness: Secondary | ICD-10-CM | POA: Diagnosis not present

## 2014-08-30 DIAGNOSIS — Z87891 Personal history of nicotine dependence: Secondary | ICD-10-CM | POA: Diagnosis not present

## 2014-08-30 LAB — BASIC METABOLIC PANEL
ANION GAP: 19 — AB (ref 5–15)
BUN: 16 mg/dL (ref 6–23)
CO2: 19 mEq/L (ref 19–32)
Calcium: 9.6 mg/dL (ref 8.4–10.5)
Chloride: 100 mEq/L (ref 96–112)
Creatinine, Ser: 1.39 mg/dL — ABNORMAL HIGH (ref 0.50–1.35)
GFR calc Af Amer: 58 mL/min — ABNORMAL LOW (ref >90)
GFR, EST NON AFRICAN AMERICAN: 50 mL/min — AB (ref >90)
GLUCOSE: 95 mg/dL (ref 70–99)
POTASSIUM: 4.1 meq/L (ref 3.7–5.3)
SODIUM: 138 meq/L (ref 137–147)

## 2014-08-30 LAB — I-STAT TROPONIN, ED: TROPONIN I, POC: 0 ng/mL (ref 0.00–0.08)

## 2014-08-30 LAB — PRO B NATRIURETIC PEPTIDE: PRO B NATRI PEPTIDE: 103.7 pg/mL (ref 0–125)

## 2014-08-30 LAB — CBC
HCT: 40.8 % (ref 39.0–52.0)
Hemoglobin: 14.4 g/dL (ref 13.0–17.0)
MCH: 30.6 pg (ref 26.0–34.0)
MCHC: 35.3 g/dL (ref 30.0–36.0)
MCV: 86.6 fL (ref 78.0–100.0)
PLATELETS: 228 10*3/uL (ref 150–400)
RBC: 4.71 MIL/uL (ref 4.22–5.81)
RDW: 12.6 % (ref 11.5–15.5)
WBC: 6.9 10*3/uL (ref 4.0–10.5)

## 2014-08-30 LAB — TROPONIN I: Troponin I: <0.3 ng/mL (ref <0.30)

## 2014-08-30 MED ORDER — OMEPRAZOLE 20 MG PO CPDR: 20.0000 mg | DELAYED_RELEASE_CAPSULE | Freq: Two times a day (BID) | ORAL | Status: AC

## 2014-08-30 NOTE — ED Provider Notes (Signed)
CSN: [id number]     Arrival date & time 08/30/14  1219 History   First MD Initiated Contact with Patient 08/30/14 1250     Chief Complaint  Patient presents with  . Chest Pain  . Hypertension      HPI Patient presents emergency department because he felt somewhat cold and clammy during church today.  He states he had some mild chest tightness last night it was transient.  He denies chest pain or shortness of breath at this time.  He states at one time during the service he may have felt slightly short of breath.  His blood pressure was checked by members of the church is noted to be elevated with blood pressures of 170/100.  He states he did not take his blood pressure this morning because he was concerned his blood pressure is slightly low based on his blood pressure readings and monitor at home.  Her recent illness.  No fevers or chills.  Denies nausea and vomiting.  No diarrhea.  No hematochezia or dark stools described.  Patient has a history of hypertension hyperlipidemia.  No history of cardiac disease.  He used to smoke cigarettes but quit in 1974.  He also reports that his had some mild upper abdominal discomfort through the week.  His been intermittent.  It usually associated just before he eats a meal and then he states after he eats a meal his discomfort seems to go away..This may represent his gastroesophageal reflux disease.  He is not in any PPIs at this time.   Past Medical History  Diagnosis Date  . Hyperlipidemia   . Hypertension   . Allergy   . calculi 2001    passed spontaneously  . Arthritis     JOINTS  . Cataract     LEFT-GROWING   Past Surgical History  Procedure Laterality Date  . Foot surgery      X11 in context of clubbing  . Colonoscopy with plypectomy  2008    due 2016, Dr Jarold MottoPatterson  . Colonoscopy     Family History  Problem Relation Age of Onset  . Diabetes Maternal Grandfather   . Breast cancer Maternal Grandmother   . Hyperlipidemia Mother   .  Hypertension Mother   . Arthritis Mother     OA  . Stroke Neg Hx   . Heart disease Neg Hx    History  Substance Use Topics  . Smoking status: Former Smoker    Quit date: 11/20/1972  . Smokeless tobacco: Never Used     Comment: smoked  1966- 1974, < 1 ppd  . Alcohol Use: Yes     Comment: rare wine    Review of Systems  All other systems reviewed and are negative.     Allergies  Aspirin  Home Medications   Prior to Admission medications   Medication Sig Start Date End Date Taking? Authorizing Provider  hydrocortisone-pramoxine [facility](ANALPRAM-HC) 2.5-1 % rectal cream Place 1 application rectally 3 (three) times daily. 06/08/14   Beverley FiedlerJay M Pyrtle, MD  nebivolol (BYSTOLIC) 10 MG tablet Take 1 tablet (10 mg total) by mouth daily. 09/10/13   Pecola LawlessWilliam F Hopper, MD  simvastatin (ZOCOR) 40 MG tablet Take one tablet by mouth at bedtime (labs overdue) 09/10/13   Pecola LawlessWilliam F Hopper, MD  spironolactone (ALDACTONE) 25 MG tablet Take 1 tablet (25 mg total) by mouth 2 (two) times daily. 03/19/14   Pecola LawlessWilliam F Hopper, MD   BP 145/94  Pulse 63  Temp(Src) 97.6 F (  36.4 C) (Oral)  Resp 12  Ht 6\' 1"  (1.854 m)  Wt 199 lb (90.266 kg)  BMI 26.26 kg/m2  SpO2 96% Physical Exam  Nursing note and vitals reviewed. Constitutional: He is oriented to person, place, and time. He appears well-developed and well-nourished.  HENT:  Head: Normocephalic and atraumatic.  Eyes: EOM are normal.  Neck: Normal range of motion.  Cardiovascular: Normal rate, regular rhythm, normal heart sounds and intact distal pulses.   Pulmonary/Chest: Effort normal and breath sounds normal. No respiratory distress.  Abdominal: Soft. He exhibits no distension. There is no tenderness.  Musculoskeletal: Normal range of motion.  Neurological: He is alert and oriented to person, place, and time.  Skin: Skin is warm and dry.  Psychiatric: He has a normal mood and affect. Judgment normal.    ED Course  Procedures (including critical care  time) Labs Review Labs Reviewed  BASIC METABOLIC PANEL - Abnormal; Notable for the following:    Creatinine, Ser 1.39 (*)    GFR calc non Af Amer 50 (*)    GFR calc Af Amer 58 (*)    Anion gap 19 (*)    All other components within normal limits  CBC  PRO B NATRIURETIC PEPTIDE  TROPONIN I  I-STAT TROPOININ, ED    Imaging Review Dg Chest 2 View  08/30/2014   CLINICAL DATA:  Chest pain and hypertension.  EXAM: CHEST - 2 VIEW  COMPARISON:  05/03/2005  FINDINGS: The heart size is normal. Stable tortuosity of the thoracic aorta. There is no evidence of pulmonary edema, consolidation, pneumothorax, nodule or pleural fluid. The visualized skeletal structures are unremarkable.  IMPRESSION: No active disease.   Electronically Signed   By: Irish LackGlenn  Yamagata M.D.   On: 08/30/2014 14:12  I personally reviewed the imaging tests through PACS system I reviewed available ER/hospitalization records through the EMR    EKG Interpretation   Date/Time:  Sunday August 30 2014 12:29:48 EDT Ventricular Rate:  69 PR Interval:  184 QRS Duration: 82 QT Interval:  358 QTC Calculation: 383 R Axis:   0 Text Interpretation:  Normal sinus rhythm Nonspecific ST and T wave  abnormality Abnormal ECG No significant change was found Confirmed by  Daegan Arizmendi  MD, Caryn BeeKEVIN ([id number]) on 08/30/2014 12:50:59 PM      ECG interpretation 1528pm  Date: 08/30/2014  Rate: 59  Rhythm: normal sinus rhythm  QRS Axis: normal  Intervals: normal  ST/T Wave abnormalities: Nonspecific ST and T wave changes  Conduction Disutrbances: none  Narrative Interpretation:   Old EKG Reviewed: No significant changes noted     MDM   Final diagnoses:  None    Overall the patient is well-appearing.  Atypical symptoms.  Doubt acute coronary syndrome.  Ambulatory in emergency department.  Labs thus far normal.  EKG x2 are normal.  Outpatient cardiology and PCP followup. Home on BID PPI     Lyanne CoKevin M Kwamane Whack, MD 08/30/14 1622

## 2014-08-30 NOTE — Discharge Instructions (Signed)

## 2014-08-30 NOTE — ED Notes (Signed)
The pt has no complaints 

## 2014-08-30 NOTE — ED Notes (Addendum)
Pt reports having feeling of indigestion for past week, yesterday pain increased. Pt reports shortness of breath last night, nausea, diaphoresis. Pt has not taken his BP meds x 2 days

## 2014-08-31 ENCOUNTER — Ambulatory Visit (INDEPENDENT_AMBULATORY_CARE_PROVIDER_SITE_OTHER): Payer: Medicare Other | Admitting: Internal Medicine

## 2014-08-31 ENCOUNTER — Encounter: Payer: Self-pay | Admitting: Internal Medicine

## 2014-08-31 VITALS — BP 138/96 | HR 58 | Temp 98.3°F | Wt 203.1 lb

## 2014-08-31 DIAGNOSIS — R0789 Other chest pain: Secondary | ICD-10-CM

## 2014-08-31 DIAGNOSIS — K219 Gastro-esophageal reflux disease without esophagitis: Secondary | ICD-10-CM

## 2014-08-31 MED ORDER — NITROGLYCERIN 0.4 MG SL SUBL: 0.4000 mg | SUBLINGUAL_TABLET | SUBLINGUAL | Status: DC | PRN

## 2014-08-31 NOTE — Progress Notes (Signed)
   Subjective:    Patient ID: Kevin BellmanDonald E Bembenek, male    DOB: 09-12-1946, 68 y.o.   MRN: [id number]  HPI  Patient is seen today for evaluation of chest pain.  He was evaluated in the ER 08/30/14.  Symptoms began around 08/24/14.  He describes it as substernal burning not associated with exertion that is relieved by eating. He was checking his blood pressure at home and getting low readings so he discontinued his bystolic and spironocatone.  On the evening of 08/29/14, he had an episode of "impending doom" and chest tightness that lasted for several minutes.  At church 08/30/14, his blood pressure was checked and it was 170/112.  He was advised to go the ER for further evaluation.   Lab work was remarkable for creat 1.39, GFR 58, troponin negative X1.  EKG compared to 6/14 shows progressive ST-T changes.   Prilosec twice daily for treatment of reflux.  He has not yet started this medication.  He has resumed his hypertensive medications.   He has not had recurrence of pain since yesterday.    There is no family history of premature CAD.    His mother passed away last night from "seizures" and he is planning a trip to [place] to make final arrangements.   He states that he occasionally awakens with a brackish taste in his mouth and vomits bile.    He was last seen by Dr Jens Somrenshaw several years ago at which time he states myoview was done and normal.    Review of Systems  Constitutional: Negative for fever, chills, activity change, appetite change and unexpected weight change.  Respiratory: Negative for cough, shortness of breath and wheezing.   Cardiovascular: Positive for chest pain. Negative for palpitations and leg swelling.  Gastrointestinal: Negative for nausea, vomiting, diarrhea, constipation, blood in stool and abdominal distention.  Skin: Negative for rash and wound.  Neurological: Negative for dizziness, syncope, weakness, numbness and headaches.       Objective:   Physical Exam    Constitutional: He is oriented to person, place, and time. He appears well-developed and well-nourished. No distress.  Eyes: Conjunctivae and EOM are normal. Pupils are equal, round, and reactive to light. Right eye exhibits no discharge. Left eye exhibits no discharge. No scleral icterus.  Neck: Normal range of motion. Neck supple. No thyromegaly present.  Cardiovascular: Normal rate and regular rhythm.   No murmur heard. Increased S2  Pulmonary/Chest: Effort normal and breath sounds normal. No respiratory distress. He has no wheezes.  Abdominal: Soft. Bowel sounds are normal. He exhibits no distension. There is no tenderness. There is no rebound and no guarding.  Musculoskeletal: Normal range of motion. He exhibits no edema and no tenderness.  Lymphadenopathy:    He has no cervical adenopathy.  Neurological: He is alert and oriented to person, place, and time.  Skin: Skin is warm and dry. No rash noted. He is not diaphoretic.  Psychiatric: He has a normal mood and affect. His behavior is normal. Judgment and thought content normal.          Assessment & Plan:

## 2014-08-31 NOTE — Patient Instructions (Signed)
Take the EKG to any emergency room or preop visits. There are nonspecific changes; as long as there is no new change these are not clinically significant . If the old EKG is not available for comparison; it may result in unnecessary hospitalization for observation with significant unnecessary expense. Reflux of gastric acid may be asymptomatic as this may occur mainly during sleep.The triggers for reflux  include stress; the "aspirin family" ; alcohol; peppermint; and caffeine (coffee, tea, cola, and chocolate). The aspirin family would include aspirin and the nonsteroidal agents such as ibuprofen &  Naproxen. Tylenol would not cause reflux. If having symptoms ; food & drink should be avoided for @ least 2 hours before going to bed.  Take the Prilosec protein pump inhibitor 30 minutes before breakfast and 30 minutes before the evening meal for 8 weeks then go back to once a day  30 minutes before breakfast.

## 2014-08-31 NOTE — Progress Notes (Signed)
Pre visit review using our clinic review tool, if applicable. No additional management support is needed unless otherwise documented below in the visit note. 

## 2014-08-31 NOTE — Progress Notes (Signed)
   Subjective:    Patient ID: Kevin Ochoa, male    DOB: [birth year], 68 y.o.   MRN: [id number]  HPI    He was seen in emergency room 08/30/14 for chest pain. Those records were reviewed.  Symptoms actually began approximately 08/24/14. It was described as substernal burning which was nonexertional and unrelieved by food intake.  He had been recording low blood pressure readings so he discontinued his Bystolic & spironolactone.  The evening of 08/29/14 he described "a sensation of impending doom" associated with chest tightness which lasted several minutes.  At church 10/11 he felt cold & clammy; his blood pressure was checked & was 170/112. He was referred to the emergency room.  Lab values included creatinine 1.39 and GFR 58 and negative troponin.   Compared to an EKG performed 6/14 there has been progressive ST-T changes.  Unfortunately his mother expired last night and he will be traveling to [place] to make final arrangements.    Review of Systems    He occasionally awakens with a brackish taste in his mouth and can vomit bile  He denies fever, chills, sweats, weight loss  He has no cough, dyspnea, or wheezing  He is not having any associated palpitations or edema.  He has no constipation diarrhea, melena, rectal bleeding.  He's had no dizziness, syncope, weakness, or numbness/tingling.     Objective:   Physical Exam   Positive pertinent findings include: He has arcus senilis. The second heart sound is increased. There are chronic deformities of the feet.  Appears healthy and well-nourished & in no acute distress No carotid bruits are present.No neck vein distention present at 10 - 15 degrees. Thyroid normal to palpation Heart rhythm and rate are normal with no gallop or murmur Chest is clear with no increased work of breathing There is no evidence of aortic aneurysm or renal artery bruits Abdomen soft with no organomegaly or masses. No HJR No clubbing,  cyanosis or edema present. Pedal pulses are intact  No ischemic skin changes are present . Fingernails healthy  Alert and oriented. Strength, tone, DTRs reflexes normal         Assessment & Plan:  #1 chest pain with some change in ST-T noted on serial EKGs  Plan: Cardiology referral will be pursued. He was given a copy of his EKGs.Should the pain recur and not resolve with nitroglycerin x1; he should go to the emergency room.

## 2014-09-09 ENCOUNTER — Telehealth: Payer: Self-pay | Admitting: Cardiology

## 2014-09-14 NOTE — Telephone Encounter (Signed)
Closed encounter °

## 2014-09-16 ENCOUNTER — Other Ambulatory Visit: Payer: Self-pay | Admitting: Internal Medicine

## 2014-09-22 ENCOUNTER — Other Ambulatory Visit: Payer: Self-pay | Admitting: Internal Medicine

## 2014-09-24 ENCOUNTER — Other Ambulatory Visit: Payer: Self-pay | Admitting: *Deleted

## 2014-09-24 DIAGNOSIS — R251 Tremor, unspecified: Secondary | ICD-10-CM

## 2014-09-28 NOTE — Progress Notes (Signed)
HPI: 68 year old male for evaluation of chest pain. Seen in emergency room October 2015 with chest pain. Chest x-ray October 2015 showed no acute disease. Troponin negative and hemoglobin normal. BUN 16 and creatinine 1.39. ProBNP 103.7. Prior to the date of evaluation the patient discontinued his blood pressure medications because he felt his blood pressure was too low based on false readings of an inaccurate blood pressure cuff. He went to church that morning and developed nausea, diaphoresis and unsteadiness. He had a vague pain in his left upper chest. His blood pressure was checked and was significantly elevated and he was sent to the emergency room. Since that time he denies dyspnea on exertion, orthopnea, PND or pedal edema. He has continued to have occasional vague discomfort in his left upper chest that is not exertional.  Current Outpatient Prescriptions  Medication Sig Dispense Refill  . BYSTOLIC 10 MG tablet TAKE 1 TABLET BY MOUTH ONCE DAILY 90 tablet 2  . Cyanocobalamin (B-12) 1000 MCG SUBL Place 1 tablet under the tongue daily.    . fexofenadine (ALLEGRA) 180 MG tablet Take 180 mg by mouth daily as needed for allergies or rhinitis.    . Multiple Vitamin (MULTIVITAMIN WITH MINERALS) TABS tablet Take 1 tablet by mouth daily.    Marland Kitchen. NITROSTAT 0.4 MG SL tablet Place 1 tablet under the tongue. Every 5 minutes as needed for chest pain  0  . omeprazole (PRILOSEC) 20 MG capsule Take 1 capsule (20 mg total) by mouth 2 (two) times daily before a meal. 60 capsule 0  . simvastatin (ZOCOR) 40 MG tablet TAKE 1 TABLET BY MOUTH EVERY NIGHT AT BEDTIME 90 tablet 1  . spironolactone (ALDACTONE) 25 MG tablet Take 1 tablet (25 mg total) by mouth 2 (two) times daily. 180 tablet 1   No current facility-administered medications for this visit.    Allergies  Allergen Reactions  . Aspirin     REACTION: hives Because of a history of documented adverse serious drug reaction;Medi Alert bracelet  is  recommended     Past Medical History  Diagnosis Date  . Hyperlipidemia   . Hypertension   . Allergy   . calculi 2001    passed spontaneously  . Arthritis     JOINTS  . Cataract     LEFT-GROWING  . Nephrolithiasis   . GERD (gastroesophageal reflux disease)     Past Surgical History  Procedure Laterality Date  . Foot surgery      X11 in context of clubbing  . Colonoscopy with plypectomy  2008    due 2016, Dr Jarold MottoPatterson  . Colonoscopy      History   Social History  . Marital Status: Widowed    Spouse Name: N/A    Number of Children: 3  . Years of Education: N/A   Occupational History  . Not on file.   Social History Main Topics  . Smoking status: Former Smoker    Quit date: 11/20/1972  . Smokeless tobacco: Never Used     Comment: smoked  1966- 1974, < 1 ppd  . Alcohol Use: Yes     Comment: rare wine  . Drug Use: No  . Sexual Activity: Not on file   Other Topics Concern  . Not on file   Social History Narrative    Family History  Problem Relation Age of Onset  . Diabetes Maternal Grandfather   . Breast cancer Maternal Grandmother   . Hyperlipidemia Mother   . Hypertension Mother   .  Arthritis Mother     OA  . Stroke Neg Hx   . Heart disease Neg Hx     ROS: Arthralgias but no fevers or chills, productive cough, hemoptysis, dysphasia, odynophagia, melena, hematochezia, dysuria, hematuria, rash, seizure activity, orthopnea, PND, pedal edema, claudication. Remaining systems are negative.  Physical Exam:   Blood pressure 137/90, pulse 60, height 6\' 1"  (1.854 m), weight 202 lb 12.8 oz (91.989 kg).  General:  Well developed/well nourished in NAD Skin warm/dry Patient not depressed No peripheral clubbing Back-normal HEENT-normal/normal eyelids Neck supple/normal carotid upstroke bilaterally; no bruits; no JVD; no thyromegaly chest - CTA/ normal expansion CV - RRR/normal S1 and S2; no murmurs, rubs or gallops;  PMI nondisplaced Abdomen -NT/ND, no  HSM, no mass, + bowel sounds, no bruit 2+ femoral pulses, no bruits Ext-no edema, chords, 2+ DP Neuro-grossly nonfocal  ECG 08/30/2014 in sinus rhythm with nonspecific ST changes.

## 2014-10-02 ENCOUNTER — Encounter: Payer: Self-pay | Admitting: Cardiology

## 2014-10-02 ENCOUNTER — Encounter: Payer: Self-pay | Admitting: *Deleted

## 2014-10-02 ENCOUNTER — Ambulatory Visit (INDEPENDENT_AMBULATORY_CARE_PROVIDER_SITE_OTHER): Payer: Medicare Other | Admitting: Cardiology

## 2014-10-02 VITALS — BP 137/90 | HR 60 | Ht 73.0 in | Wt 202.8 lb

## 2014-10-02 DIAGNOSIS — R072 Precordial pain: Secondary | ICD-10-CM

## 2014-10-02 DIAGNOSIS — I10 Essential (primary) hypertension: Secondary | ICD-10-CM

## 2014-10-02 DIAGNOSIS — R079 Chest pain, unspecified: Secondary | ICD-10-CM | POA: Insufficient documentation

## 2014-10-02 DIAGNOSIS — E782 Mixed hyperlipidemia: Secondary | ICD-10-CM

## 2014-10-02 NOTE — Assessment & Plan Note (Signed)
Symptoms are somewhat atypical. Plan nuclear study for risk stratification. 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

## 2014-10-02 NOTE — Assessment & Plan Note (Signed)
Continue statin. 

## 2014-10-02 NOTE — Patient Instructions (Signed)
Your physician recommends that you schedule a follow-up appointment in: 3 MONTHS WITH DR CRENSHAW  Your physician has requested that you have a lexiscan myoview. For further information please visit [email or web address]. Please follow instruction sheet, as given.    

## 2014-10-02 NOTE — Assessment & Plan Note (Signed)
Blood pressure is mildly elevated but he states it has been controlled at home since reinitiating medications. He will follow this and we will increase medications as needed.

## 2014-10-06 ENCOUNTER — Telehealth (HOSPITAL_COMMUNITY): Payer: Self-pay

## 2014-10-06 NOTE — Telephone Encounter (Signed)
Encounter complete. 

## 2014-10-07 ENCOUNTER — Telehealth (HOSPITAL_COMMUNITY): Payer: Self-pay

## 2014-10-07 NOTE — Telephone Encounter (Signed)
Encounter complete. 

## 2014-10-08 ENCOUNTER — Ambulatory Visit (HOSPITAL_COMMUNITY)
Admission: RE | Admit: 2014-10-08 | Discharge: 2014-10-08 | Disposition: A | Discharge status: Home / Self Care | Payer: Medicare Other | Source: Ambulatory Visit | Attending: Cardiology | Admitting: Cardiology

## 2014-10-08 DIAGNOSIS — Z87891 Personal history of nicotine dependence: Secondary | ICD-10-CM | POA: Insufficient documentation

## 2014-10-08 DIAGNOSIS — E785 Hyperlipidemia, unspecified: Secondary | ICD-10-CM | POA: Diagnosis not present

## 2014-10-08 DIAGNOSIS — R42 Dizziness and giddiness: Secondary | ICD-10-CM | POA: Insufficient documentation

## 2014-10-08 DIAGNOSIS — R079 Chest pain, unspecified: Secondary | ICD-10-CM | POA: Diagnosis not present

## 2014-10-08 DIAGNOSIS — R9431 Abnormal electrocardiogram [ECG] [EKG]: Secondary | ICD-10-CM | POA: Insufficient documentation

## 2014-10-08 DIAGNOSIS — I10 Essential (primary) hypertension: Secondary | ICD-10-CM | POA: Diagnosis not present

## 2014-10-08 MED ORDER — TECHNETIUM TC 99M SESTAMIBI GENERIC - CARDIOLITE
30.0000 | Freq: Once | INTRAVENOUS | Status: AC | PRN
  Administered 2014-10-08: 30 via INTRAVENOUS

## 2014-10-08 MED ORDER — REGADENOSON 0.4 MG/5ML IV SOLN
0.4000 mg | Freq: Once | INTRAVENOUS | Status: AC
  Administered 2014-10-08: 0.4 mg via INTRAVENOUS

## 2014-10-08 MED ORDER — TECHNETIUM TC 99M SESTAMIBI GENERIC - CARDIOLITE
10.0000 | Freq: Once | INTRAVENOUS | Status: AC | PRN
  Administered 2014-10-08: 10 via INTRAVENOUS

## 2014-10-08 NOTE — Procedures (Addendum)
[facility] [address] [place] [address] [phone]  Cardiology Nuclear Med Study  Kevin BellmanDonald E Hurless is a 68 y.o. male     MRN : [id number]     DOB: 07/01/1946  Procedure Date: 10/08/2014  Nuclear Med Background Indication for Stress Test:  Abnormal EKG History:  No prior cardiac or respiratory history reported;No prior NUC MPI for comparison. Cardiac Risk Factors: History of Smoking, Hypertension and Lipids  Symptoms:  Chest Pain, Dizziness and Light-Headedness   Nuclear Pre-Procedure Caffeine/Decaff Intake:  7:00pm NPO After: 5:00am   IV Site: R Forearm  IV 0.9% NS with Angio Cath:  22g  Chest Size (in):  42"  IV Started by: Berdie OgrenAmanda Wease, RN  Height: 6\' 1"  (1.854 m)  Cup Size: n/a  BMI:  Body mass index is 26.66 kg/(m^2). Weight:  202 lb (91.627 kg)   Tech Comments:  n/a    Nuclear Med Study 1 or 2 day study: 1 day  Stress Test Type:  Lexiscan  Order Authorizing Provider:  Olga MillersBrian Crenshaw, MD   Resting Radionuclide: Technetium 10131m Sestamibi  Resting Radionuclide Dose: 10.8 mCi   Stress Radionuclide:  Technetium 4231m Sestamibi  Stress Radionuclide Dose: 30.8 mCi           Stress Protocol Rest HR: 52 Stress HR: 62  Rest BP: 145/92 Stress BP: 138/95  Exercise Time (min): n/a METS: n/a   Predicted Max HR: 150 bpm % Max HR: 51.33 bpm Rate Pressure Product: [id number]  Dose of Adenosine (mg):  n/a Dose of Lexiscan: 0.4 mg  Dose of Atropine (mg): n/a Dose of Dobutamine: n/a mcg/kg/min (at max HR)  Stress Test Technologist: Esperanza Sheetserry-Marie Martin, CCT Nuclear Technologist: Koren Shiverobin Moffitt, CNMT   Rest Procedure:  Myocardial perfusion imaging was performed at rest 45 minutes following the intravenous administration of Technetium 4631m Sestamibi. Stress Procedure:  The patient received IV Lexiscan 0.4 mg over 15-seconds.  Technetium 1231m Sestamibi injected IV at 30-seconds.  There were no significant changes with Lexiscan.   Quantitative spect images were obtained after a 45 minute delay.  Transient Ischemic Dilatation (Normal <1.22):  1.19 QGS EDV:  86 ml QGS ESV:  37 ml LV Ejection Fraction: 57%       Rest ECG: Sinus bradycardia with no ST changes  Stress ECG: No significant ST segment change suggestive of ischemia.  QPS Raw Data Images:  Acquisition technically good; normal left ventricular size. Stress Images:  Normal homogeneous uptake in all areas of the myocardium. Rest Images:  Normal homogeneous uptake in all areas of the myocardium. Subtraction (SDS):  No evidence of ischemia.  Impression Exercise Capacity:  Lexiscan with no exercise. BP Response:  Normal blood pressure response. Clinical Symptoms:  No chest pain or dyspnea ECG Impression:  No significant ST segment change suggestive of ischemia. Comparison with Prior Nuclear Study: No previous nuclear study performed  Overall Impression:  Normal stress nuclear study.  LV Wall Motion:  NL LV Function; NL Wall Motion   Olga MillersBrian Crenshaw, MD  10/08/2014 12:35 PM

## 2014-10-28 ENCOUNTER — Ambulatory Visit (INDEPENDENT_AMBULATORY_CARE_PROVIDER_SITE_OTHER): Payer: Medicare Other | Admitting: Neurology

## 2014-10-28 ENCOUNTER — Other Ambulatory Visit: Payer: Self-pay | Admitting: Internal Medicine

## 2014-10-28 DIAGNOSIS — R202 Paresthesia of skin: Secondary | ICD-10-CM

## 2014-10-28 DIAGNOSIS — R251 Tremor, unspecified: Secondary | ICD-10-CM

## 2014-10-28 NOTE — Procedures (Signed)
[facility]  [address], [address]  [place], [place] [address] Tel: [phone] Fax:  [phone] Test Date:  10/28/2014  Patient: Kevin MatterDonald Spilker DOB: 1946-04-15 Physician: Nita Sickleonika Leilanny Fluitt  Sex: Male Height: 6\' 1"  Ref Phys: Marga MelnickHopper, William  ID#: [id number] Temp:  Technician: Ala BentSusan Reid R. NCS T.   Patient Complaints: Patient is a 68 year old male her for evaluation of his left hand paresthesias, weakness, and tremor.  NCV & EMG Findings: Extensive electrodiagnostic testing of the left upper extremity shows:  1. Median, ulnar, radial, and palmar sensory responses are normal.  2. Median and ulnar motor responses are within normal limits.  3. There is no evidence of active or chronic motor axon loss changes affecting any of the tested muscles. Motor unit configuration and recruitment pattern is normal.   Impression: This is a normal study of the left upper extremity. In particular, there is no evidence of carpal tunnel syndrome or a cervical radiculopathy affecting the left upper extremity.   ___________________________ Nita Sickleonika Luree Palla    Nerve Conduction Studies Anti Sensory Summary Table   Stim Site NR Peak (ms) Norm Peak (ms) P-T Amp (V) Norm P-T Amp  Left Median Anti Sensory (2nd Digit)  3.2C    distance 14cm due to large hands  Wrist    3.1 <3.8 44.4 >10  Left Radial Anti Sensory (Base 1st Digit)  3.2C  Wrist    2.1 <2.8 26.4 >10  Left Ulnar Anti Sensory (5th Digit)  3.2C    at 12cm  Wrist    2.8 <3.2 23.0 >5   Motor Summary Table   Stim Site NR Onset (ms) Norm Onset (ms) O-P Amp (mV) Norm O-P Amp Site1 Site2 Delta-0 (ms) Dist (cm) Vel (m/s) Norm Vel (m/s)  Left Median Motor (Abd Poll Brev)  3.2C    unable to relax  Wrist    3.0 <4.0 8.7 >5 Elbow Wrist 5.4 32.0 59 >50  Elbow    8.4  8.2         Left Ulnar Motor (Abd Dig Minimi)  3.2C  Wrist    2.1 <3.1 8.4 >7 B Elbow Wrist 4.8 26.0 54 >50  B Elbow    6.9  8.4  A Elbow B Elbow 2.0 10.0 50 >50  A Elbow     8.9  7.6          Comparison Summary Table   Stim Site NR Peak (ms) Norm Peak (ms) P-T Amp (V) Site1 Site2 Delta-P (ms) Norm Delta (ms)  Left Median/Ulnar Palm Comparison (Wrist - 8cm)  3.2C  Median Palm    1.9 <2.2 38.6 Median Palm Ulnar Palm 0.1   Ulnar Palm    1.8 <2.2 13.2       EMG   Side Muscle Ins Act Fibs Psw Fasc Number Recrt Dur Dur. Amp Amp. Poly Poly. Comment  Left 1stDorInt Nml Nml Nml Nml Nml Nml Nml Nml Nml Nml Nml Nml N/A  Left Ext Indicis Nml Nml Nml Nml Nml Nml Nml Nml Nml Nml Nml Nml N/A  Left PronatorTeres Nml Nml Nml Nml Nml Nml Nml Nml Nml Nml Nml Nml N/A  Left Biceps Nml Nml Nml Nml Nml Nml Nml Nml Nml Nml Nml Nml N/A  Left Triceps Nml Nml Nml Nml Nml Nml Nml Nml Nml Nml Nml Nml N/A  Left Deltoid Nml Nml Nml Nml Nml Nml Nml Nml Nml Nml Nml Nml N/A      Waveforms:

## 2014-11-20 HISTORY — PX: COLONOSCOPY: SHX174

## 2015-01-12 NOTE — Progress Notes (Signed)
      HPI: FU chest pain. See previous note for details. Nuclear study November 2015 showed an ejection fraction of 57% and normal perfusion. Since last seen, the patient denies any dyspnea on exertion, orthopnea, PND, pedal edema, palpitations, syncope or chest pain.   Current Outpatient Prescriptions  Medication Sig Dispense Refill  . BYSTOLIC 10 MG tablet TAKE 1 TABLET BY MOUTH ONCE DAILY 90 tablet 2  . Cyanocobalamin (B-12) 1000 MCG SUBL Place 1 tablet under the tongue daily.    . fexofenadine (ALLEGRA) 180 MG tablet Take 180 mg by mouth daily as needed for allergies or rhinitis.    . Multiple Vitamin (MULTIVITAMIN WITH MINERALS) TABS tablet Take 1 tablet by mouth daily.    Marland Kitchen. NITROSTAT 0.4 MG SL tablet Place 1 tablet under the tongue. Every 5 minutes as needed for chest pain  0  . omeprazole (PRILOSEC) 20 MG capsule Take 1 capsule (20 mg total) by mouth 2 (two) times daily before a meal. 60 capsule 0  . simvastatin (ZOCOR) 40 MG tablet TAKE 1 TABLET BY MOUTH EVERY NIGHT AT BEDTIME 90 tablet 1  . spironolactone (ALDACTONE) 25 MG tablet TAKE 1 TABLET BY MOUTH TWICE DAILY 180 tablet 1   No current facility-administered medications for this visit.     Past Medical History  Diagnosis Date  . Hyperlipidemia   . Hypertension   . Allergy   . calculi 2001    passed spontaneously  . Arthritis     JOINTS  . Cataract     LEFT-GROWING  . Nephrolithiasis   . GERD (gastroesophageal reflux disease)     Past Surgical History  Procedure Laterality Date  . Foot surgery      X11 in context of clubbing  . Colonoscopy with plypectomy  2008    due 2016, Dr Jarold MottoPatterson  . Colonoscopy      History   Social History  . Marital Status: Widowed    Spouse Name: N/A  . Number of Children: 3  . Years of Education: N/A   Occupational History  . Not on file.   Social History Main Topics  . Smoking status: Former Smoker    Quit date: 11/20/1972  . Smokeless tobacco: Never Used   Comment: smoked  1966- 1974, < 1 ppd  . Alcohol Use: Yes     Comment: rare wine  . Drug Use: No  . Sexual Activity: Not on file   Other Topics Concern  . Not on file   Social History Narrative    ROS: no fevers or chills, productive cough, hemoptysis, dysphasia, odynophagia, melena, hematochezia, dysuria, hematuria, rash, seizure activity, orthopnea, PND, pedal edema, claudication. Remaining systems are negative.  Physical Exam: Well-developed well-nourished in no acute distress.  Skin is warm and dry.  HEENT is normal.  Neck is supple.  Chest is clear to auscultation with normal expansion.  Cardiovascular exam is regular rate and rhythm.  Abdominal exam nontender or distended. No masses palpated. Extremities show no edema. neuro grossly intact  ECG sinus rhythm at a rate of 57. Poor R-wave progression. Nonspecific ST changes.

## 2015-01-13 ENCOUNTER — Ambulatory Visit (INDEPENDENT_AMBULATORY_CARE_PROVIDER_SITE_OTHER): Payer: Medicare Other | Admitting: Cardiology

## 2015-01-13 ENCOUNTER — Encounter: Payer: Self-pay | Admitting: Cardiology

## 2015-01-13 ENCOUNTER — Encounter: Payer: Self-pay | Admitting: *Deleted

## 2015-01-13 VITALS — BP 112/82 | HR 57 | Ht 73.0 in | Wt 211.9 lb

## 2015-01-13 DIAGNOSIS — R072 Precordial pain: Secondary | ICD-10-CM

## 2015-01-13 DIAGNOSIS — R0989 Other specified symptoms and signs involving the circulatory and respiratory systems: Secondary | ICD-10-CM | POA: Insufficient documentation

## 2015-01-13 DIAGNOSIS — I10 Essential (primary) hypertension: Secondary | ICD-10-CM

## 2015-01-13 DIAGNOSIS — E782 Mixed hyperlipidemia: Secondary | ICD-10-CM

## 2015-01-13 NOTE — Assessment & Plan Note (Signed)
Continue statin. 

## 2015-01-13 NOTE — Assessment & Plan Note (Signed)
Scheduled abdominal ultrasound to exclude aneurysm. 

## 2015-01-13 NOTE — Assessment & Plan Note (Signed)
Blood pressure controlled. Continue present medications. 

## 2015-01-13 NOTE — Assessment & Plan Note (Signed)
No further symptoms. Previous nuclear study negative.

## 2015-01-13 NOTE — Patient Instructions (Signed)
Your physician recommends that you schedule a follow-up appointment in: AS NEEDED  Your physician has requested that you have an abdominal aorta duplex. During this test, an ultrasound is used to evaluate the aorta. Allow 30 minutes for this exam. Do not eat after midnight the day before and avoid carbonated beverages   

## 2015-01-27 ENCOUNTER — Ambulatory Visit (HOSPITAL_COMMUNITY)
Admission: RE | Admit: 2015-01-27 | Discharge: 2015-01-27 | Disposition: A | Discharge status: Home / Self Care | Payer: Medicare Other | Source: Ambulatory Visit | Attending: Internal Medicine | Admitting: Internal Medicine

## 2015-01-27 DIAGNOSIS — R0989 Other specified symptoms and signs involving the circulatory and respiratory systems: Secondary | ICD-10-CM

## 2015-01-27 NOTE — Progress Notes (Signed)
Abdominal Aortic Duplex Completed °Brianna L Mazza,RVT °

## 2015-02-23 HISTORY — PX: CATARACT EXTRACTION: SUR2

## 2015-03-04 ENCOUNTER — Other Ambulatory Visit: Payer: Self-pay | Admitting: Internal Medicine

## 2015-05-17 ENCOUNTER — Other Ambulatory Visit: Payer: Self-pay

## 2015-06-06 ENCOUNTER — Other Ambulatory Visit: Payer: Self-pay | Admitting: Internal Medicine

## 2015-06-07 ENCOUNTER — Other Ambulatory Visit: Payer: Self-pay | Admitting: Internal Medicine

## 2015-06-07 ENCOUNTER — Other Ambulatory Visit: Payer: Self-pay | Admitting: Emergency Medicine

## 2015-06-07 MED ORDER — SPIRONOLACTONE 25 MG PO TABS: 25.0000 mg | ORAL_TABLET | Freq: Two times a day (BID) | ORAL | Status: DC

## 2015-06-08 ENCOUNTER — Other Ambulatory Visit: Payer: Self-pay

## 2015-06-08 MED ORDER — SPIRONOLACTONE 25 MG PO TABS: 25.0000 mg | ORAL_TABLET | Freq: Two times a day (BID) | ORAL | Status: DC

## 2015-06-10 ENCOUNTER — Other Ambulatory Visit: Payer: Self-pay | Admitting: Emergency Medicine

## 2015-06-15 ENCOUNTER — Other Ambulatory Visit: Payer: Self-pay | Admitting: Emergency Medicine

## 2015-06-15 ENCOUNTER — Other Ambulatory Visit: Payer: Self-pay | Admitting: Internal Medicine

## 2015-06-15 MED ORDER — SPIRONOLACTONE 25 MG PO TABS: 25.0000 mg | ORAL_TABLET | Freq: Two times a day (BID) | ORAL | Status: AC

## 2015-06-17 ENCOUNTER — Other Ambulatory Visit: Payer: Self-pay | Admitting: Emergency Medicine

## 2015-06-17 MED ORDER — NEBIVOLOL HCL 10 MG PO TABS: 10.0000 mg | ORAL_TABLET | Freq: Every day | ORAL | Status: DC

## 2015-06-18 ENCOUNTER — Other Ambulatory Visit: Payer: Self-pay | Admitting: Internal Medicine

## 2015-07-01 ENCOUNTER — Encounter: Payer: Self-pay | Admitting: Internal Medicine

## 2015-07-01 ENCOUNTER — Other Ambulatory Visit (INDEPENDENT_AMBULATORY_CARE_PROVIDER_SITE_OTHER): Payer: Medicare Other

## 2015-07-01 ENCOUNTER — Ambulatory Visit (INDEPENDENT_AMBULATORY_CARE_PROVIDER_SITE_OTHER): Payer: Medicare Other | Admitting: Internal Medicine

## 2015-07-01 VITALS — BP 126/84 | HR 58 | Temp 98.0°F | Resp 16 | Ht 73.0 in | Wt 209.0 lb

## 2015-07-01 DIAGNOSIS — E782 Mixed hyperlipidemia: Secondary | ICD-10-CM | POA: Diagnosis not present

## 2015-07-01 DIAGNOSIS — Z0000 Encounter for general adult medical examination without abnormal findings: Secondary | ICD-10-CM

## 2015-07-01 DIAGNOSIS — Z86010 Personal history of colonic polyps: Secondary | ICD-10-CM

## 2015-07-01 DIAGNOSIS — I10 Essential (primary) hypertension: Secondary | ICD-10-CM

## 2015-07-01 LAB — LIPID PANEL
CHOLESTEROL: 141 mg/dL (ref 0–200)
HDL: 38 mg/dL — ABNORMAL LOW (ref >39.00)
LDL Cholesterol: 72 mg/dL (ref 0–99)
NonHDL: 103.29
Total CHOL/HDL Ratio: 4
Triglycerides: 154 mg/dL — ABNORMAL HIGH (ref 0.0–149.0)
VLDL: 30.8 mg/dL (ref 0.0–40.0)

## 2015-07-01 LAB — CBC WITH DIFFERENTIAL/PLATELET
Basophils Absolute: 0 10*3/uL (ref 0.0–0.1)
Basophils Relative: 0.5 % (ref 0.0–3.0)
Eosinophils Absolute: 0.3 10*3/uL (ref 0.0–0.7)
Eosinophils Relative: 4.5 % (ref 0.0–5.0)
HEMATOCRIT: 41 % (ref 39.0–52.0)
HEMOGLOBIN: 13.8 g/dL (ref 13.0–17.0)
Lymphocytes Relative: 25.8 % (ref 12.0–46.0)
Lymphs Abs: 1.6 10*3/uL (ref 0.7–4.0)
MCHC: 33.7 g/dL (ref 30.0–36.0)
MCV: 91.3 fl (ref 78.0–100.0)
MONO ABS: 0.4 10*3/uL (ref 0.1–1.0)
MONOS PCT: 7.3 % (ref 3.0–12.0)
Neutro Abs: 3.8 10*3/uL (ref 1.4–7.7)
Neutrophils Relative %: 61.9 % (ref 43.0–77.0)
Platelets: 229 10*3/uL (ref 150.0–400.0)
RBC: 4.49 Mil/uL (ref 4.22–5.81)
RDW: 13.2 % (ref 11.5–15.5)
WBC: 6.1 10*3/uL (ref 4.0–10.5)

## 2015-07-01 LAB — BASIC METABOLIC PANEL
BUN: 18 mg/dL (ref 6–23)
CO2: 28 meq/L (ref 19–32)
Calcium: 9.7 mg/dL (ref 8.4–10.5)
Chloride: 104 mEq/L (ref 96–112)
Creatinine, Ser: 1.69 mg/dL — ABNORMAL HIGH (ref 0.40–1.50)
GFR: 51.67 mL/min — ABNORMAL LOW (ref >60.00)
Glucose, Bld: 101 mg/dL — ABNORMAL HIGH (ref 70–99)
Potassium: 4.4 mEq/L (ref 3.5–5.1)
Sodium: 140 mEq/L (ref 135–145)

## 2015-07-01 LAB — HEPATIC FUNCTION PANEL
ALK PHOS: 52 U/L (ref 39–117)
ALT: 28 U/L (ref 0–53)
AST: 20 U/L (ref 0–37)
Albumin: 4.5 g/dL (ref 3.5–5.2)
BILIRUBIN DIRECT: 0.1 mg/dL (ref 0.0–0.3)
BILIRUBIN TOTAL: 0.6 mg/dL (ref 0.2–1.2)
Total Protein: 7.3 g/dL (ref 6.0–8.3)

## 2015-07-01 LAB — TSH: TSH: 3.81 u[IU]/mL (ref 0.35–4.50)

## 2015-07-01 NOTE — Assessment & Plan Note (Signed)
CBC

## 2015-07-01 NOTE — Assessment & Plan Note (Signed)
Lipids, LFTs, TSH ,CK 

## 2015-07-01 NOTE — Assessment & Plan Note (Signed)
Blood pressure goals reviewed. BMET 
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
## 2015-07-01 NOTE — Patient Instructions (Signed)
Minimal Blood Pressure Goal= AVERAGE < 140/90;  Ideal is an AVERAGE < 135/85. This AVERAGE should be calculated from @ least 5-7 BP readings taken @ different times of day on different days of week. You should not respond to isolated BP readings , but rather the AVERAGE for that week .Please bring your  blood pressure cuff to office visits to verify that it is reliable.It  can also be checked against the blood pressure device at the pharmacy. Finger or wrist cuffs are not dependable; an arm cuff is.   Your next office appointment will be determined based upon review of your pending labs  and  xrays  Those written interpretation of the lab results and instructions will be transmitted to you by My Chart   Critical results will be called.   Followup as needed for any active or acute issue. Please report any significant change in your symptoms. 

## 2015-07-01 NOTE — Progress Notes (Signed)
Pre visit review using our clinic review tool, if applicable. No additional management support is needed unless otherwise documented below in the visit note. 

## 2015-07-02 ENCOUNTER — Other Ambulatory Visit (INDEPENDENT_AMBULATORY_CARE_PROVIDER_SITE_OTHER): Payer: Medicare Other

## 2015-07-02 ENCOUNTER — Other Ambulatory Visit: Payer: Self-pay | Admitting: Internal Medicine

## 2015-07-02 DIAGNOSIS — R739 Hyperglycemia, unspecified: Secondary | ICD-10-CM

## 2015-07-02 DIAGNOSIS — R7989 Other specified abnormal findings of blood chemistry: Secondary | ICD-10-CM | POA: Insufficient documentation

## 2015-07-02 DIAGNOSIS — E269 Hyperaldosteronism, unspecified: Secondary | ICD-10-CM

## 2015-07-02 LAB — HEMOGLOBIN A1C: HEMOGLOBIN A1C: 5.6 % (ref 4.6–6.5)

## 2015-09-07 ENCOUNTER — Other Ambulatory Visit: Payer: Self-pay | Admitting: Internal Medicine

## 2015-11-20 IMAGING — CR DG CHEST 2V
2 series · 2 of 2 positions shown · non-contrast
Comparison: 05/03/2005

CLINICAL DATA: Chest pain and hypertension.

EXAM:
CHEST - 2 VIEW

[w chest pa]
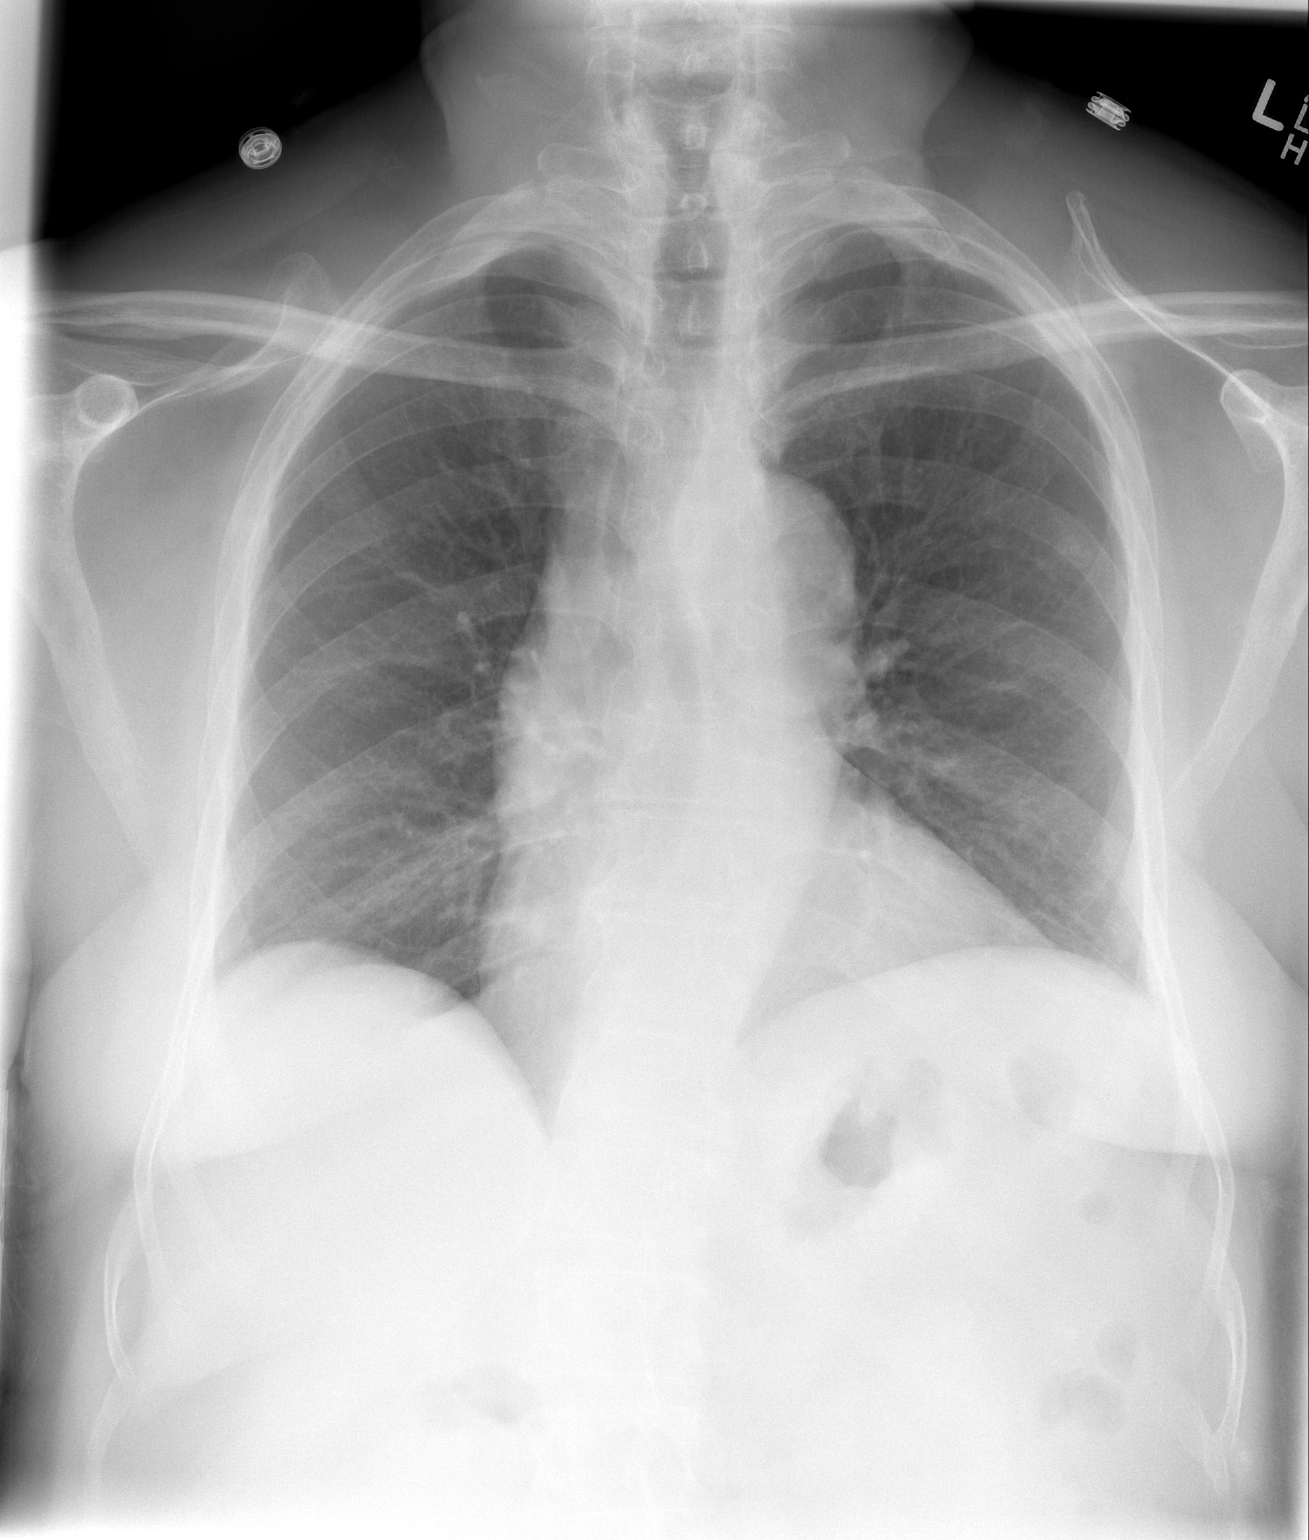

[w chest lat]
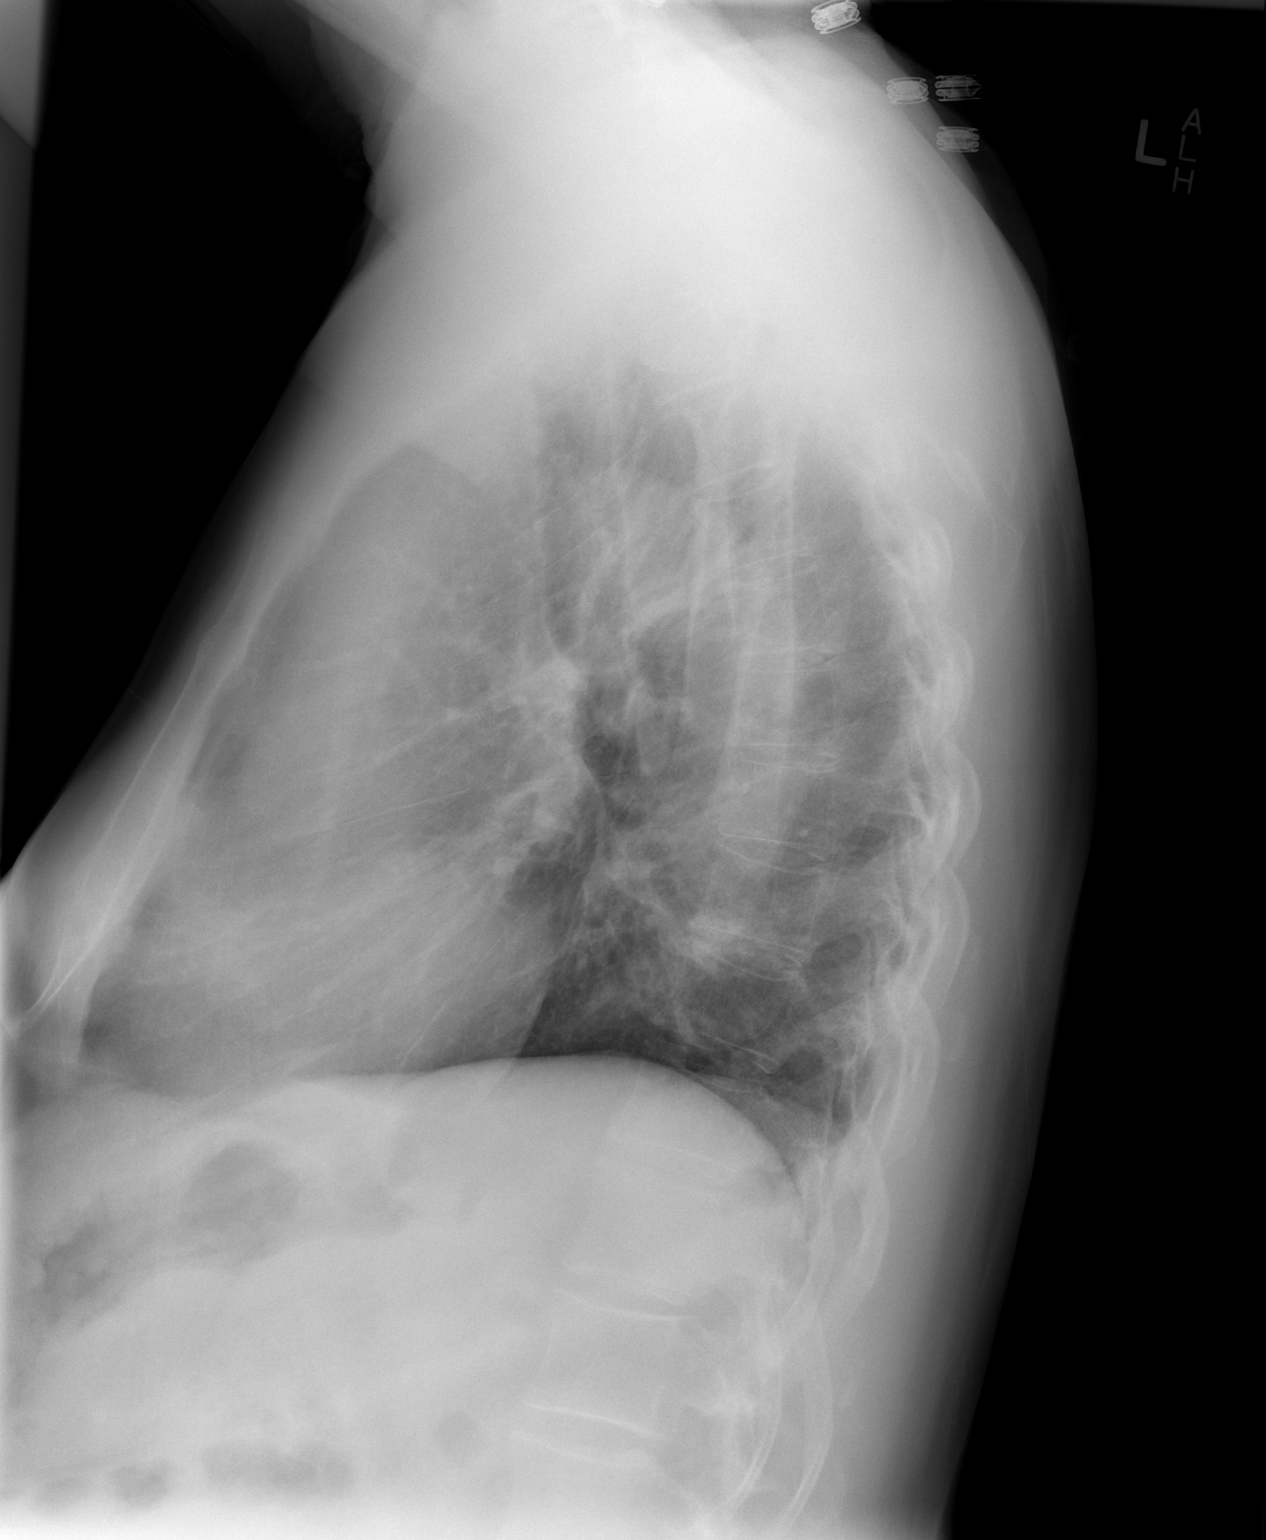

[2 of 2 positions shown; findings below may reference images not displayed]

FINDINGS: The heart size is normal. Stable tortuosity of the thoracic aorta.
There is no evidence of pulmonary edema, consolidation,
pneumothorax, nodule or pleural fluid. The visualized skeletal
structures are unremarkable.
IMPRESSION: No active disease.

## 2016-02-17 ENCOUNTER — Encounter (INDEPENDENT_AMBULATORY_CARE_PROVIDER_SITE_OTHER): Payer: Self-pay

## 2016-04-06 ENCOUNTER — Other Ambulatory Visit: Payer: Self-pay | Admitting: Internal Medicine

## 2016-04-06 NOTE — Telephone Encounter (Signed)
Left message advising patient to call back to schedule appt/get established with new pcp---appt can be aug/sept/oct 2017---let Madalena Kesecker know when appt is made so that refill for simvastatin can be sent to patient's pharm

## 2016-05-24 ENCOUNTER — Telehealth: Payer: Self-pay

## 2016-05-24 NOTE — Telephone Encounter (Signed)
Patient is on the list for [facility] 2017 and may be a good candidate for an AWV in 2017. Please let me know if/when appt is scheduled.   

## 2016-09-27 ENCOUNTER — Telehealth: Payer: Self-pay

## 2016-09-27 NOTE — Telephone Encounter (Signed)
[facility] May want to route to Care Guide

## 2016-10-31 ENCOUNTER — Other Ambulatory Visit: Payer: Self-pay | Admitting: Internal Medicine

## 2019-05-02 ENCOUNTER — Encounter: Payer: Self-pay | Admitting: *Deleted

## 2022-10-18 ENCOUNTER — Ambulatory Visit (INDEPENDENT_AMBULATORY_CARE_PROVIDER_SITE_OTHER): Payer: Medicare Other

## 2022-10-18 ENCOUNTER — Ambulatory Visit (HOSPITAL_COMMUNITY)
Admission: EM | Admit: 2022-10-18 | Discharge: 2022-10-18 | Disposition: A | Discharge status: Home / Self Care | Payer: Medicare Other | Attending: Emergency Medicine | Admitting: Emergency Medicine

## 2022-10-18 ENCOUNTER — Encounter (HOSPITAL_COMMUNITY): Payer: Self-pay

## 2022-10-18 DIAGNOSIS — M419 Scoliosis, unspecified: Secondary | ICD-10-CM | POA: Diagnosis not present

## 2022-10-18 DIAGNOSIS — M5431 Sciatica, right side: Secondary | ICD-10-CM | POA: Diagnosis not present

## 2022-10-18 DIAGNOSIS — M545 Low back pain, unspecified: Secondary | ICD-10-CM

## 2022-10-18 DIAGNOSIS — M47816 Spondylosis without myelopathy or radiculopathy, lumbar region: Secondary | ICD-10-CM | POA: Diagnosis not present

## 2022-10-18 MED ORDER — ACETAMINOPHEN 325 MG PO TABS
975.0000 mg | ORAL_TABLET | Freq: Once | ORAL | Status: AC
  Administered 2022-10-18: 975 mg via ORAL

## 2022-10-18 MED ORDER — ACETAMINOPHEN 325 MG PO TABS
ORAL_TABLET | ORAL | Status: AC
  Filled 2022-10-18: qty 3

## 2022-10-18 MED ORDER — BACLOFEN 10 MG PO TABS: 10.0000 mg | ORAL_TABLET | Freq: Three times a day (TID) | ORAL | 0 refills | Status: AC

## 2022-10-18 MED ORDER — METHYLPREDNISOLONE SODIUM SUCC 125 MG IJ SOLR
80.0000 mg | Freq: Once | INTRAMUSCULAR | Status: AC
  Administered 2022-10-18: 80 mg via INTRAMUSCULAR

## 2022-10-18 MED ORDER — METHYLPREDNISOLONE SODIUM SUCC 125 MG IJ SOLR
INTRAMUSCULAR | Status: AC
  Filled 2022-10-18: qty 2

## 2022-10-18 MED ORDER — ACETAMINOPHEN 500 MG PO TABS: 1000.0000 mg | ORAL_TABLET | Freq: Three times a day (TID) | ORAL | 0 refills | Status: AC

## 2022-10-18 MED ORDER — METHYLPREDNISOLONE 4 MG PO TBPK: ORAL_TABLET | ORAL | 0 refills | Status: AC

## 2022-10-18 NOTE — ED Provider Notes (Signed)
[facility]    CSN: [id number] Arrival date & time: 10/18/22  1750    HISTORY   Chief Complaint  Patient presents with   Back Pain    Patient can barely walk or move. Having sciatic nerve pains for more than a week. Unable to do any daily activities - Entered by patient   HPI Kevin Ochoa is a pleasant, 76 y.o. male who presents to urgent care today. Patient complains of a 1 week history of progressively worsening right-sided sciatic nerve pain.  Patient states has been unable to do any of his regular activities, can barely walk or move.  Patient states he has had sciatic nerve pain on the right for many years.  Patient denies recent fall or acute injury to his lower back.  Patient states he has been taking Tylenol without meaningful relief of his pain.  States last imaging performed of his lumbar spine was in 2020.  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  The history is provided by the patient.   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 Authorizing Provider  BYSTOLIC 10 MG tablet TAKE 1 TABLET BY MOUTH DAILY 09/07/15  Yes Pecola Lawless, MD  Cyanocobalamin (B-12) 1000 MCG SUBL Place 1 tablet under the tongue daily.   Yes [provider]  fexofenadine (ALLEGRA) 180 MG tablet Take 180 mg by mouth daily as needed for allergies or rhinitis.   Yes [provider]  Multiple Vitamin (MULTIVITAMIN WITH MINERALS) TABS tablet Take 1 tablet by mouth daily.   Yes [provider]  NITROSTAT 0.4 MG SL tablet Place 1 tablet under the tongue. Every 5 minutes as needed for chest pain 09/01/14  Yes [provider]  omeprazole (PRILOSEC) 20 MG capsule Take 1 capsule (20 mg total) by mouth 2 (two) times daily before a meal. 08/30/14  Yes Azalia Bilis, MD  simvastatin (ZOCOR) 40 MG tablet TAKE 1 TABLET BY MOUTH EVERY NIGHT AT BEDTIME 03/05/15  Yes Pecola Lawless, MD  spironolactone (ALDACTONE) 25 MG tablet Take 1 tablet (25 mg total) by mouth 2 (two) times daily. 06/15/15  Yes Pecola Lawless, MD    Family  History Family History  Problem Relation Age of Onset   Diabetes Maternal Grandfather    Breast cancer Maternal Grandmother    Hyperlipidemia Mother    Hypertension Mother    Arthritis Mother        OA   Other Mother        In context of urinary tract infection   Stroke Neg Hx    Heart disease Neg Hx    Social History Social History   Tobacco Use   Smoking status: Former    Types: Cigarettes    Quit date: 11/20/1972    Years since quitting: 49.9   Smokeless tobacco: Never   Tobacco comments:    smoked  1966- 1974, < 1 ppd  Substance Use Topics   Alcohol use: No    Comment: rare wine   Drug use: No   Allergies   Aspirin  Review  of Systems Review of Systems Pertinent findings revealed after performing a 14 point review of systems has been noted in the history of present illness.  Physical Exam Triage Vital Signs ED Triage Vitals  Enc Vitals Group     BP 09/16/21 0827 (!) 147/82     Pulse Rate 09/16/21 0827 72     Resp 09/16/21 0827 18     Temp 09/16/21 0827 98.3 F (36.8 C)     Temp Source 09/16/21 0827 Oral     SpO2 09/16/21 0827 98 %     Weight --      Height --      Head Circumference --      Peak Flow --      Pain Score 09/16/21 0826 5     Pain Loc --      Pain Edu? --      Excl. in GC? --    Updated Vital Signs BP (!) 152/90 (BP Location: Right Arm)   Pulse (!) 58   Temp 97.9 F (36.6 C) (Oral)   Resp 16   Ht 6\' 1"  (1.854 m)   Wt 226 lb (102.5 kg)   SpO2 99%   BMI 29.82 kg/m   Physical Exam Vitals and nursing note reviewed.  Constitutional:      General: He is not in acute distress.    Appearance: Normal appearance. He is normal weight. He is not ill-appearing.  HENT:     Head: Normocephalic and atraumatic.  Eyes:     Extraocular Movements: Extraocular movements intact.     Conjunctiva/sclera: Conjunctivae normal.     Pupils: Pupils are equal, round, and reactive to light.  Cardiovascular:     Rate and Rhythm: Normal rate and regular rhythm.  Pulmonary:     Effort: Pulmonary effort is normal.     Breath sounds: Normal breath sounds.  Musculoskeletal:     Cervical back: Normal range of motion and neck supple.     Lumbar back: Spasms and tenderness present. No swelling, edema, deformity, signs of trauma, lacerations or bony tenderness. Decreased range of motion. Positive right straight leg raise test and positive left straight leg raise test. No scoliosis.  Skin:    General: Skin is warm and dry.  Neurological:     General: No focal deficit present.     Mental Status: He is alert and oriented to person, place, and time. Mental status is at baseline.     Cranial Nerves: Cranial  nerves 2-12 are intact.     Sensory: Sensation is intact.     Motor: Motor  function is intact.     Coordination: Coordination is intact.     Gait: Gait abnormal (2/2 pain).     Deep Tendon Reflexes: Reflexes are normal and symmetric.  Psychiatric:        Mood and Affect: Mood normal.        Behavior: Behavior normal.        Thought Content: Thought content normal.        Judgment: Judgment normal.     UC Couse / Diagnostics / Procedures:     Radiology DG Lumbar Spine Complete  Result Date: 10/18/2022 CLINICAL DATA:  Acute on chronic low back pain, right-sided pain EXAM: LUMBAR SPINE - COMPLETE 4+ VIEW COMPARISON:  None Available. FINDINGS: Frontal, bilateral oblique, lateral views of the lumbar spine are obtained on 5 images. There are 5 non-rib-bearing lumbar type vertebral bodies identified, with minimal right convex curvature centered at L2. Otherwise alignment is anatomic. No acute fractures. Mild spondylosis and facet hypertrophy at the L5-S1 level. Remaining disc spaces are well preserved. Sacroiliac joints are normal. IMPRESSION: 1. Mild spondylosis and facet hypertrophy at L5-S1. 2. Mild right convex lumbar scoliosis. 3. No acute bony abnormality. Electronically Signed   By: Sharlet Salina M.D.   On: 10/18/2022 19:40    Procedures Procedures (including critical care time) EKG  Pending results:  Labs Reviewed - No data to display  Medications Ordered in UC: Medications  methylPREDNISolone sodium succinate (SOLU-MEDROL) 125 mg/2 mL injection 80 mg (has no administration in time range)  acetaminophen (TYLENOL) tablet 975 mg (has no administration in time range)    UC Diagnoses / Final Clinical Impressions(s)   I have reviewed the triage vital signs and the nursing notes.  Pertinent labs & imaging results that were available during my care of the patient were reviewed by me and considered in my medical decision making (see chart for details).    Final diagnoses:   Sciatica of right side  Spondylosis of lumbar spine  Facet hypertrophy of lumbar region  Scoliosis of lumbar spine, unspecified scoliosis type   Patient advised of x-ray findings.  Due to patient's history of CKD, will avoid NSAIDs for pain relief.  Patient provided with an injection of Solu-Medrol during his visit today.  Patient provided with prescription for baclofen for significant spasm and a steroid Dosepak.  Patient also advised to continue Tylenol 1000 mg 3 times daily scheduled.  Return precautions advised.  Patient advised to follow-up with his regular provider in [place], [place].  ED Prescriptions     Medication Sig Dispense Auth. Provider   methylPREDNISolone (MEDROL DOSEPAK) 4 MG TBPK tablet Take 24 mg on day 1, 20 mg on day 2, 16 mg on day 3, 12 mg on day 4, 8 mg on day 5, 4 mg on day 6.  Take all tablets in each row at once, do not spread tablets out throughout the day. 21 tablet Theadora Rama Scales, PA-C   baclofen (LIORESAL) 10 MG tablet Take 1 tablet (10 mg total) by mouth 3 (three) times daily for 7 days. 21 tablet Theadora Rama Scales, PA-C   acetaminophen (TYLENOL) 500 MG tablet Take 2 tablets (1,000 mg total) by mouth every 8 (eight) hours. 180 tablet Theadora Rama Scales, PA-C      PDMP not reviewed this encounter.  Discharge Instructions:   Discharge Instructions      The mainstay of therapy for musculoskeletal pain is reduction of inflammation and relaxation of tension which is causing inflammation.  Keep in  mind, pain always begets more pain.  To help you stay ahead of your pain and inflammation, I have provided the following regimen for you:   During your visit today, you received an injection of methylprednisolone, high-dose steroidal anti-inflammatory medication that should significantly reduce your pain for the next 6 to 8 hours.    You were also provided with 975 mg of Tylenol during your visit today.  Please continue taking Tylenol 1000 mg every 8  hours.   This evening, you can begin taking baclofen 10 mg.  This is a highly effective muscle relaxer and antispasmodic which should continue to provide you with relaxation of your tense muscles, allow you to sleep well and to keep your pain under control.  You can continue taking this medication 3 times daily as you need to.  If you find that this medication makes you too sleepy, you can break them in half for your daytime doses and, if needed double them for your nighttime dose.  Do not take more than 30 mg of baclofen in a 24-hour period.   Tomorrow morning, please begin taking methylprednisolone.  Please take 1 full row tablets at once with your breakfast meal.  If you have had significant resolution of your pain before you finish the entire prescription, please feel free to discontinue.  It is not important to finish every ta dose blet.   During the day, please set aside time to apply ice to the affected area 4 times daily for 20 minutes each application.  This can be achieved by using a bag of frozen peas or corn, a Ziploc bag filled with ice and water, or Ziploc bag filled with half rubbing alcohol and half Dawn dish detergent, frozen into a slush.  Please be careful not to apply ice directly to your skin, always place a soft cloth between you and the ice pack.   Please avoid attempts to stretch or strengthen the affected area until you are feeling completely pain-free.  Attempts to do so will only prolong the healing process.   Please consider discussing referral to a different physical therapist with your primary care provider.  Physical therapist are very good at teasing out the underlying cause of acute lower back pain and helping with prevention of future recurrences but sometimes finding the right one can be a little challenging.   Thank you for visiting urgent care today.  I wish you a safe trip back to [place].  I appreciate the opportunity to participate in your  care.       Disposition Upon Discharge:  Condition: stable for discharge home Home: take medications as prescribed; routine discharge instructions as discussed; follow up as advised.  Patient presented with an acute illness with associated systemic symptoms and significant discomfort requiring urgent management. In my opinion, this is a condition that a prudent lay person (someone who possesses an average knowledge of health and medicine) may potentially expect to result in complications if not addressed urgently such as respiratory distress, impairment of bodily function or dysfunction of bodily organs.   Routine symptom specific, illness specific and/or disease specific instructions were discussed with the patient and/or caregiver at length.   As such, the patient has been evaluated and assessed, work-up was performed and treatment was provided in alignment with urgent care protocols and evidence based medicine.  Patient/parent/caregiver has been advised that the patient may require follow up for further testing and treatment if the symptoms continue in spite of treatment, as clinically  indicated and appropriate.  Patient/parent/caregiver has been advised to report to orthopedic urgent care clinic or [facility] in 3-5 days if no better; follow-up with orthopedics, PCP or the Emergency Department if new signs and symptoms develop or if the current signs or symptoms continue to change or worsen for further workup, evaluation and treatment as clinically indicated and appropriate  The patient will follow up with their current PCP if and as advised. If the patient does not currently have a PCP we will have assisted them in obtaining one.   The patient may need specialty follow up if the symptoms continue, in spite of conservative treatment and management, for further workup, evaluation, consultation and treatment as clinically indicated and appropriate.  Patient/parent/caregiver  verbalized understanding and agreement of plan as discussed.  All questions were addressed during visit.  Please see discharge instructions below for further details of plan.  This office note has been dictated using Teaching laboratory technicianDragon speech recognition software.  Unfortunately, this method of dictation can sometimes lead to typographical or grammatical errors.  I apologize for your inconvenience in advance if this occurs.  Please do not hesitate to reach out to me if clarification is needed.      Theadora RamaMorgan, Anesa Fronek Scales, [place] JerseyPA-C 10/20/22 [phone]

## 2022-10-18 NOTE — ED Triage Notes (Signed)
Chief Complaint: back/hip pain on the right side. Patient has history of sciatica pain. No recent falls or injuries .   Onset: 2-3 days  OTC medications tried: Yes- acetaminophen     with no relief

## 2022-10-18 NOTE — Discharge Instructions (Addendum)
The mainstay of therapy for musculoskeletal pain is reduction of inflammation and relaxation of tension which is causing inflammation.  Keep in mind, pain always begets more pain.  To help you stay ahead of your pain and inflammation, I have provided the following regimen for you:   During your visit today, you received an injection of methylprednisolone, high-dose steroidal anti-inflammatory medication that should significantly reduce your pain for the next 6 to 8 hours.    You were also provided with 975 mg of Tylenol during your visit today.  Please continue taking Tylenol 1000 mg every 8 hours.   This evening, you can begin taking baclofen 10 mg.  This is a highly effective muscle relaxer and antispasmodic which should continue to provide you with relaxation of your tense muscles, allow you to sleep well and to keep your pain under control.  You can continue taking this medication 3 times daily as you need to.  If you find that this medication makes you too sleepy, you can break them in half for your daytime doses and, if needed double them for your nighttime dose.  Do not take more than 30 mg of baclofen in a 24-hour period.   Tomorrow morning, please begin taking methylprednisolone.  Please take 1 full row tablets at once with your breakfast meal.  If you have had significant resolution of your pain before you finish the entire prescription, please feel free to discontinue.  It is not important to finish every ta dose blet.   During the day, please set aside time to apply ice to the affected area 4 times daily for 20 minutes each application.  This can be achieved by using a bag of frozen peas or corn, a Ziploc bag filled with ice and water, or Ziploc bag filled with half rubbing alcohol and half Dawn dish detergent, frozen into a slush.  Please be careful not to apply ice directly to your skin, always place a soft cloth between you and the ice pack.   Please avoid attempts to stretch or strengthen  the affected area until you are feeling completely pain-free.  Attempts to do so will only prolong the healing process.   Please consider discussing referral to a different physical therapist with your primary care provider.  Physical therapist are very good at teasing out the underlying cause of acute lower back pain and helping with prevention of future recurrences but sometimes finding the right one can be a little challenging.   Thank you for visiting urgent care today.  I wish you a safe trip back to [place].  I appreciate the opportunity to participate in your care.

## 2024-04-20 DEATH — deceased
